# Patient Record
Sex: Male | Born: 1985 | Hispanic: No | Marital: Single | Smoking: Current some day smoker
Health system: Southern US, Community
[De-identification: ages and names within clinical notes are randomized; demographics above are authoritative.]

## PROBLEM LIST (undated history)

## (undated) DIAGNOSIS — G930 Cerebral cysts: Secondary | ICD-10-CM

## (undated) DIAGNOSIS — N200 Calculus of kidney: Secondary | ICD-10-CM

## (undated) DIAGNOSIS — K589 Irritable bowel syndrome without diarrhea: Secondary | ICD-10-CM

## (undated) DIAGNOSIS — F329 Major depressive disorder, single episode, unspecified: Secondary | ICD-10-CM

## (undated) DIAGNOSIS — F419 Anxiety disorder, unspecified: Secondary | ICD-10-CM

## (undated) DIAGNOSIS — F1110 Opioid abuse, uncomplicated: Secondary | ICD-10-CM

## (undated) DIAGNOSIS — D649 Anemia, unspecified: Secondary | ICD-10-CM

## (undated) DIAGNOSIS — G43909 Migraine, unspecified, not intractable, without status migrainosus: Secondary | ICD-10-CM

## (undated) DIAGNOSIS — F32A Depression, unspecified: Secondary | ICD-10-CM

## (undated) DIAGNOSIS — F1190 Opioid use, unspecified, uncomplicated: Secondary | ICD-10-CM

## (undated) HISTORY — DX: Depression, unspecified: F32.A

## (undated) HISTORY — DX: Migraine, unspecified, not intractable, without status migrainosus: G43.909

## (undated) HISTORY — DX: Anxiety disorder, unspecified: F41.9

## (undated) HISTORY — DX: Major depressive disorder, single episode, unspecified: F32.9

---

## 2016-01-20 ENCOUNTER — Emergency Department (HOSPITAL_BASED_OUTPATIENT_CLINIC_OR_DEPARTMENT_OTHER): Payer: Self-pay

## 2016-01-20 ENCOUNTER — Encounter (HOSPITAL_BASED_OUTPATIENT_CLINIC_OR_DEPARTMENT_OTHER): Payer: Self-pay

## 2016-01-20 ENCOUNTER — Emergency Department (HOSPITAL_BASED_OUTPATIENT_CLINIC_OR_DEPARTMENT_OTHER)
Admission: EM | Admit: 2016-01-20 | Discharge: 2016-01-20 | Disposition: A | Discharge status: Home / Self Care | Payer: Self-pay | Attending: Emergency Medicine | Admitting: Emergency Medicine

## 2016-01-20 DIAGNOSIS — R05 Cough: Secondary | ICD-10-CM | POA: Insufficient documentation

## 2016-01-20 DIAGNOSIS — M7989 Other specified soft tissue disorders: Secondary | ICD-10-CM | POA: Insufficient documentation

## 2016-01-20 DIAGNOSIS — H539 Unspecified visual disturbance: Secondary | ICD-10-CM | POA: Insufficient documentation

## 2016-01-20 DIAGNOSIS — R0789 Other chest pain: Secondary | ICD-10-CM | POA: Insufficient documentation

## 2016-01-20 DIAGNOSIS — R002 Palpitations: Secondary | ICD-10-CM | POA: Insufficient documentation

## 2016-01-20 DIAGNOSIS — F17210 Nicotine dependence, cigarettes, uncomplicated: Secondary | ICD-10-CM | POA: Insufficient documentation

## 2016-01-20 DIAGNOSIS — R109 Unspecified abdominal pain: Secondary | ICD-10-CM | POA: Insufficient documentation

## 2016-01-20 DIAGNOSIS — R509 Fever, unspecified: Secondary | ICD-10-CM | POA: Insufficient documentation

## 2016-01-20 HISTORY — DX: Opioid abuse, uncomplicated: F11.10

## 2016-01-20 HISTORY — DX: Irritable bowel syndrome without diarrhea: K58.9

## 2016-01-20 HISTORY — DX: Calculus of kidney: N20.0

## 2016-01-20 HISTORY — DX: Opioid use, unspecified, uncomplicated: F11.90

## 2016-01-20 HISTORY — DX: Anemia, unspecified: D64.9

## 2016-01-20 HISTORY — DX: Cerebral cysts: G93.0

## 2016-01-20 LAB — COMPREHENSIVE METABOLIC PANEL
ALT: 16 U/L — AB (ref 17–63)
AST: 20 U/L (ref 15–41)
Albumin: 3.9 g/dL (ref 3.5–5.0)
Alkaline Phosphatase: 78 U/L (ref 38–126)
Anion gap: 6 (ref 5–15)
BUN: 15 mg/dL (ref 6–20)
CHLORIDE: 107 mmol/L (ref 101–111)
CO2: 27 mmol/L (ref 22–32)
CREATININE: 0.55 mg/dL — AB (ref 0.61–1.24)
Calcium: 9.4 mg/dL (ref 8.9–10.3)
GFR calc Af Amer: >60 mL/min (ref >60)
GFR calc non Af Amer: >60 mL/min (ref >60)
Glucose, Bld: 81 mg/dL (ref 65–99)
Potassium: 3.7 mmol/L (ref 3.5–5.1)
SODIUM: 140 mmol/L (ref 135–145)
Total Bilirubin: 0.4 mg/dL (ref 0.3–1.2)
Total Protein: 7.6 g/dL (ref 6.5–8.1)

## 2016-01-20 LAB — CBC WITH DIFFERENTIAL/PLATELET
BASOS ABS: 0 10*3/uL (ref 0.0–0.1)
Basophils Relative: 0 %
Eosinophils Absolute: 0.2 10*3/uL (ref 0.0–0.7)
Eosinophils Relative: 3 %
HEMATOCRIT: 40.1 % (ref 39.0–52.0)
HEMOGLOBIN: 13.6 g/dL (ref 13.0–17.0)
LYMPHS PCT: 30 %
Lymphs Abs: 2.8 10*3/uL (ref 0.7–4.0)
MCH: 26.4 pg (ref 26.0–34.0)
MCHC: 33.9 g/dL (ref 30.0–36.0)
MCV: 77.7 fL — AB (ref 78.0–100.0)
MONO ABS: 0.8 10*3/uL (ref 0.1–1.0)
Monocytes Relative: 8 %
NEUTROS ABS: 5.5 10*3/uL (ref 1.7–7.7)
NEUTROS PCT: 59 %
Platelets: 303 10*3/uL (ref 150–400)
RBC: 5.16 MIL/uL (ref 4.22–5.81)
RDW: 15.4 % (ref 11.5–15.5)
WBC: 9.4 10*3/uL (ref 4.0–10.5)

## 2016-01-20 LAB — LIPASE, BLOOD: Lipase: 66 U/L — ABNORMAL HIGH (ref 11–51)

## 2016-01-20 LAB — TROPONIN I: Troponin I: <0.03 ng/mL (ref <0.031)

## 2016-01-20 LAB — D-DIMER, QUANTITATIVE: D-Dimer, Quant: 1.28 ug/mL-FEU — ABNORMAL HIGH (ref 0.00–0.50)

## 2016-01-20 MED ORDER — IOPAMIDOL (ISOVUE-370) INJECTION 76%
100.0000 mL | Freq: Once | INTRAVENOUS | Status: AC | PRN
  Administered 2016-01-20: 100 mL via INTRAVENOUS

## 2016-01-20 MED ORDER — SODIUM CHLORIDE 0.9 % IV BOLUS (SEPSIS)
2000.0000 mL | Freq: Once | INTRAVENOUS | Status: AC
  Administered 2016-01-20: 2000 mL via INTRAVENOUS

## 2016-01-20 MED ORDER — IOPAMIDOL (ISOVUE-300) INJECTION 61%: 100.0000 mL | Freq: Once | INTRAVENOUS | Status: DC | PRN

## 2016-01-20 MED ORDER — PROMETHAZINE HCL 25 MG PO TABS: 25.0000 mg | ORAL_TABLET | Freq: Four times a day (QID) | ORAL | Status: DC | PRN

## 2016-01-20 MED ORDER — ZOLPIDEM TARTRATE 5 MG PO TABS: 5.0000 mg | ORAL_TABLET | Freq: Every evening | ORAL | Status: AC | PRN

## 2016-01-20 NOTE — ED Notes (Signed)
Pt c/o CP since 1pm-at [facility] for heroin abuse since last thursday

## 2016-01-20 NOTE — ED Notes (Signed)
MD at bedside. 

## 2016-01-20 NOTE — ED Provider Notes (Signed)
CSN: [id number]     Arrival date & time 01/20/16  1736 History  By signing my name below, I, [facility], attest that this documentation has been prepared under the direction and in the presence of Pricilla Loveless, MD. Electronically Signed: Randell Patient, ED Scribe. 01/20/2016. 10:47 PM.   Chief Complaint  Patient presents with  . Chest Pain   The history is provided by the patient. No language interpreter was used.   HPI Comments: Sean Rios is a 30 y.o. male with an hx of heroin abuse, kidney stones, IBS, anemia, and opiate misuse who presents to the Emergency Department complaining of constant, waxing and waning, moderate CP that he describes as tightness onset 6 hours ago. Pt states that he is detoxing from heroin at [facility] for the past 5 days and has been having withdrawal symptoms including diaphoresis. He reports that today he noticed chest tightness with heart palpations, spots in his eyes, tremors, SOB while walking in the facility. He reports cough, fever TMAX of 99, bilateral ankle swelling, and mild abdominal pain. Chest tightness, visual disturbance, heart palpitations, tremors, and SOB improved by lying down. Per pt, he last used heroin 5 days ago. He is a current cigarette smoker. He is allergic to aspirin, ibuprofen, naproxen, and Tylenol which cause him hematuria. Denies taking any other illicit drugs. Denies hx of HTN. Denies hemoptysis, diarrhea, or any other symptoms currently.  Past Medical History  Diagnosis Date  . Heroin abuse   . Arachnoid cyst   . Kidney stone   . IBS (irritable bowel syndrome)   . Anemia   . Opiate misuse    History reviewed. No pertinent past surgical history. No family history on file. Social History  Substance Use Topics  . Smoking status: Current Some Day Smoker  . Smokeless tobacco: None  . Alcohol Use: No    Review of Systems  Constitutional: Positive for fever.  Eyes: Positive for visual disturbance.  Respiratory:  Positive for cough, chest tightness and shortness of breath.   Cardiovascular: Positive for palpitations and leg swelling.  Gastrointestinal: Positive for abdominal pain. Negative for diarrhea.  Neurological: Positive for tremors.  All other systems reviewed and are negative.  Allergies  Aspirin; Ibuprofen; Naproxen; and Tylenol  Home Medications   Prior to Admission medications   Not on File   BP 145/92 mmHg  Pulse 95  Temp(Src) 97.8 F (36.6 C) (Oral)  Resp 31  Ht 6\' 2"  (1.88 m)  Wt 130 lb (58.968 kg)  BMI 16.68 kg/m2  SpO2 100% Physical Exam  Constitutional: He is oriented to person, place, and time. He appears well-developed and well-nourished.  HENT:  Head: Normocephalic and atraumatic.  Right Ear: External ear normal.  Left Ear: External ear normal.  Nose: Nose normal.  Eyes: Right eye exhibits no discharge. Left eye exhibits no discharge.  Neck: Neck supple.  Cardiovascular: Normal rate, regular rhythm, normal heart sounds and intact distal pulses.   HR ~100. 2+ radial pulses bilaterally.  Pulmonary/Chest: Effort normal and breath sounds normal. He exhibits tenderness.  Diffuse mild chest tenderness.  Abdominal: Soft. There is no tenderness.  Musculoskeletal: He exhibits no edema.  Neurological: He is alert and oriented to person, place, and time.  Skin: Skin is warm and dry.  Nursing note and vitals reviewed.   ED Course  Procedures   DIAGNOSTIC STUDIES: Oxygen Saturation is 100% on RA, normal by my interpretation.    COORDINATION OF CARE: 7:07 PM Will order IV fluids, chest  x-ray, and labs. Discussed treatment plan with pt at bedside and pt agreed to plan.  10:46 PM Reviewed labs and chest imaging results. Will discharge pt.  Labs Review Labs Reviewed  COMPREHENSIVE METABOLIC PANEL - Abnormal; Notable for the following:    Creatinine, Ser 0.55 (*)    ALT 16 (*)    All other components within normal limits  LIPASE, BLOOD - Abnormal; Notable for the  following:    Lipase 66 (*)    All other components within normal limits  CBC WITH DIFFERENTIAL/PLATELET - Abnormal; Notable for the following:    MCV 77.7 (*)    All other components within normal limits  D-DIMER, QUANTITATIVE (NOT AT [facility]) - Abnormal; Notable for the following:    D-Dimer, Quant 1.28 (*)    All other components within normal limits  TROPONIN I  TROPONIN I    Imaging Review Dg Chest 2 View  01/20/2016  CLINICAL DATA:  Chest pain. EXAM: CHEST  2 VIEW COMPARISON:  None. FINDINGS: The heart size and mediastinal contours are within normal limits. Both lungs are clear. No pneumothorax or pleural effusion is noted. The visualized skeletal structures are unremarkable. IMPRESSION: No active cardiopulmonary disease. Electronically Signed   By: Lupita Raider, M.D.   On: 01/20/2016 20:42   Ct Angio Chest Pe W/cm &/or Wo Cm  01/20/2016  CLINICAL DATA:  30 year old male with shortness of breath and elevated D-dimer. EXAM: CT ANGIOGRAPHY CHEST WITH CONTRAST TECHNIQUE: Multidetector CT imaging of the chest was performed using the standard protocol during bolus administration of intravenous contrast. Multiplanar CT image reconstructions and MIPs were obtained to evaluate the vascular anatomy. CONTRAST:  75 cc Isovue 370 COMPARISON:  Chest radiograph dated 01/20/2016 FINDINGS: The lungs are clear. There is no pleural effusion or pneumothorax. The central airways are patent. The thoracic aorta and the visualized the origins of the great vessels of the aortic arch appear patent. There is no CT evidence of pulmonary embolism. There is no cardiomegaly or pericardial effusion. No hilar or mediastinal adenopathy. The esophagus and the thyroid gland are grossly unremarkable. There is no axillary adenopathy. The chest wall soft tissues appear unremarkable. The osseous structures are intact. The visualized upper abdomen appears unremarkable. Review of the MIP images confirms the above findings.  IMPRESSION: No acute intrathoracic pathology. No CT evidence of pulmonary embolism. Electronically Signed   By: Elgie Collard M.D.   On: 01/20/2016 21:56   I have personally reviewed and evaluated these images and lab results as part of my medical decision-making.   EKG Interpretation   Date/Time:  Monday Jan 20 2016 17:48:53 EDT Ventricular Rate:  121 PR Interval:  158 QRS Duration: 88 QT Interval:  310 QTC Calculation: 440 R Axis:   98 Text Interpretation:  Sinus tachycardia Rightward axis Borderline ECG No  old tracing to compare Confirmed by Shalunda Lindh MD, Roselin Wiemann [phone]) on  01/20/2016 6:51:19 PM      MDM   Final diagnoses:  Chest tightness    No obvious cause for the patient's chest tightness or shortness of breath. He was initially tachycardic which appears sinus. This likely accounts for the palpitations feeling. However he does not appear to have increased work of breathing currently and is resting comfortably. CT scan shows no PE. Possibly this is related to some of his withdrawal given relatively recent hair when recurrence. However he currently appears stable besides mild hypertension. Given he has never had this before I do not think he needs to  be emergently started on blood pressure medicine. Recommend he check his blood pressure at home and follow-up with her PCP if this continues. This is highly unlikely to be ACS based on presentation. Discharged with return precautions.  I personally performed the services described in this documentation, which was scribed in my presence. The recorded information has been reviewed and is accurate.    Pricilla LovelessScott Jack Mineau, MD 01/21/16 [phone]

## 2016-01-20 NOTE — ED Notes (Signed)
Patient transported to CT 

## 2016-01-22 ENCOUNTER — Encounter (HOSPITAL_BASED_OUTPATIENT_CLINIC_OR_DEPARTMENT_OTHER): Payer: Self-pay | Admitting: Emergency Medicine

## 2016-01-22 ENCOUNTER — Emergency Department (HOSPITAL_BASED_OUTPATIENT_CLINIC_OR_DEPARTMENT_OTHER)
Admission: EM | Admit: 2016-01-22 | Discharge: 2016-01-22 | Disposition: A | Discharge status: Home / Self Care | Payer: Self-pay | Attending: Emergency Medicine | Admitting: Emergency Medicine

## 2016-01-22 DIAGNOSIS — F17200 Nicotine dependence, unspecified, uncomplicated: Secondary | ICD-10-CM | POA: Insufficient documentation

## 2016-01-22 DIAGNOSIS — R000 Tachycardia, unspecified: Secondary | ICD-10-CM | POA: Insufficient documentation

## 2016-01-22 DIAGNOSIS — I10 Essential (primary) hypertension: Secondary | ICD-10-CM | POA: Insufficient documentation

## 2016-01-22 LAB — TROPONIN I

## 2016-01-22 MED ORDER — METOPROLOL TARTRATE 50 MG PO TABS
50.0000 mg | ORAL_TABLET | Freq: Once | ORAL | Status: AC
  Administered 2016-01-22: 50 mg via ORAL
  Filled 2016-01-22: qty 1

## 2016-01-22 MED ORDER — METOPROLOL SUCCINATE ER 50 MG PO TB24: 50.0000 mg | ORAL_TABLET | Freq: Two times a day (BID) | ORAL | Status: DC

## 2016-01-22 NOTE — ED Notes (Signed)
Pt in c/o chest pain which he was seen here previously for this week with negative workup. D-dimer was elevated and pt was tachycardic at that time. Continues to be tachycardic and that's what he was sent here today. [facility].

## 2016-01-22 NOTE — ED Notes (Signed)
C/o chest pain increased w standing, sitting or movement,  States it is less when lying,  Was seen here Monday for same

## 2016-01-22 NOTE — Discharge Instructions (Signed)
Metoprolol as prescribed.  Follow-up with primary doctor in the next week for a recheck.   Hypertension Hypertension is another name for high blood pressure. High blood pressure forces your heart to work harder to pump blood. A blood pressure reading has two numbers, which includes a higher number over a lower number (example: 110/72). HOME CARE   Have your blood pressure rechecked by your doctor.  Only take medicine as told by your doctor. Follow the directions carefully. The medicine does not work as well if you skip doses. Skipping doses also puts you at risk for problems.  Do not smoke.  Monitor your blood pressure at home as told by your doctor. GET HELP IF:  You think you are having a reaction to the medicine you are taking.  You have repeat headaches or feel dizzy.  You have puffiness (swelling) in your ankles.  You have trouble with your vision. GET HELP RIGHT AWAY IF:   You get a very bad headache and are confused.  You feel weak, numb, or faint.  You get chest or belly (abdominal) pain.  You throw up (vomit).  You cannot breathe very well. MAKE SURE YOU:   Understand these instructions.  Will watch your condition.  Will get help right away if you are not doing well or get worse.   This information is not intended to replace advice given to you by your health care provider. Make sure you discuss any questions you have with your health care provider.   Document Released: 02/10/2008 Document Revised: 08/29/2013 Document Reviewed: 06/16/2013 Elsevier Interactive Patient Education 2016 [employer] Inc[employer]  Nonspecific Tachycardia Tachycardia is a faster than normal heartbeat (more than 100 beats per minute). In adults, the heart normally beats between 60 and 100 times a minute. A fast heartbeat may be a normal response to exercise or stress. It does not necessarily mean that something is wrong. However, sometimes when your heart beats too fast it may not be able to  pump enough blood to the rest of your body. This can result in chest pain, shortness of breath, dizziness, and even fainting. Nonspecific tachycardia means that the specific cause or pattern of your tachycardia is unknown. CAUSES  Tachycardia may be harmless or it may be due to a more serious underlying cause. Possible causes of tachycardia include:  Exercise or exertion.  Fever.  Pain or injury.  Infection.  Loss of body fluids (dehydration).  Overactive thyroid.  Lack of red blood cells (anemia).  Anxiety and stress.  Alcohol.  Caffeine.  Tobacco products.  Diet pills.  Illegal drugs.  Heart disease. SYMPTOMS  Rapid or irregular heartbeat (palpitations).  Suddenly feeling your heart beating (cardiac awareness).  Dizziness.  Tiredness (fatigue).  Shortness of breath.  Chest pain.  Nausea.  Fainting. DIAGNOSIS  Your caregiver will perform a physical exam and take your medical history. In some cases, a heart specialist (cardiologist) may be consulted. Your caregiver may also order:  Blood tests.  Electrocardiography. This test records the electrical activity of your heart.  A heart monitoring test. TREATMENT  Treatment will depend on the likely cause of your tachycardia. The goal is to treat the underlying cause of your tachycardia. Treatment methods may include:  Replacement of fluids or blood through an intravenous (IV) tube for moderate to severe dehydration or anemia.  New medicines or changes in your current medicines.  Diet and lifestyle changes.  Treatment for certain infections.  Stress relief or relaxation methods. HOME CARE INSTRUCTIONS  Rest.  Drink enough fluids to keep your urine clear or pale yellow.  Do not smoke.  Avoid:  Caffeine.  Tobacco.  Alcohol.  Chocolate.  Stimulants such as over-the-counter diet pills or pills that help you stay awake.  Situations that cause anxiety or stress.  Illegal drugs such as  marijuana, phencyclidine (PCP), and cocaine.  Only take medicine as directed by your caregiver.  Keep all follow-up appointments as directed by your caregiver. SEEK IMMEDIATE MEDICAL CARE IF:   You have pain in your chest, upper arms, jaw, or neck.  You become weak, dizzy, or feel faint.  You have palpitations that will not go away.  You vomit, have diarrhea, or pass blood in your stool.  Your skin is cool, pale, and wet.  You have a fever that will not go away with rest, fluids, and medicine. MAKE SURE YOU:   Understand these instructions.  Will watch your condition.  Will get help right away if you are not doing well or get worse.   This information is not intended to replace advice given to you by your health care provider. Make sure you discuss any questions you have with your health care provider.   Document Released: 10/01/2004 Document Revised: 11/16/2011 Document Reviewed: 03/08/2015 Elsevier Interactive Patient Education [employer]

## 2016-01-22 NOTE — ED Provider Notes (Signed)
CSN: [id number]     Arrival date & time 01/22/16  1920 History  By signing my name below, I, [facility], attest that this documentation has been prepared under the direction and in the presence of Geoffery Lyons, MD. Electronically Signed: Randell Patient, ED Scribe. 01/22/2016. 9:12 PM.   Chief Complaint  Patient presents with  . Chest Pain   HPI Comments: Sean Rios is a 30 y.o. male brought in from [facility] who presents to the Emergency Department complaining of constant, unchanging CP that he describes as tightness that radiates to his back onset 2 days ago. Pt has been at [facility] for the past 6 days detoxing from heroin and was seen for same complaint 2 days ago where he received a negative chest imaging and was discharged. He notes that he was told by the [facility] staff that he is still tachycardic with an elevated BP and was advised to present to the ED today. He reports associated heart palpitations, SOB, visual disturbance that he describes as spots in his eyes for the same time period. He reports that he is detoxing for heroin and last used 1 week ago. Denies similar symptoms in the past. Denies any other symptoms currently.   Patient is a 30 y.o. male presenting with chest pain. The history is provided by the patient. No language interpreter was used.  Chest Pain Pain location:  Unable to specify Pain quality: tightness   Pain radiates to:  Upper back Pain radiates to the back: yes   Pain severity:  Moderate Onset quality:  Gradual Duration:  2 days Timing:  Constant Progression:  Unchanged Chronicity:  New Relieved by:  None tried Worsened by:  Nothing tried Ineffective treatments:  None tried Associated symptoms: palpitations and shortness of breath     Past Medical History  Diagnosis Date  . Heroin abuse   . Arachnoid cyst   . Kidney stone   . IBS (irritable bowel syndrome)   . Anemia   . Opiate misuse    History reviewed. No pertinent past surgical  history. History reviewed. No pertinent family history. Social History  Substance Use Topics  . Smoking status: Current Some Day Smoker  . Smokeless tobacco: None  . Alcohol Use: No    Review of Systems  Eyes: Positive for visual disturbance.  Respiratory: Positive for chest tightness and shortness of breath.   Cardiovascular: Positive for chest pain and palpitations.  All other systems reviewed and are negative.  Allergies  Aspirin; Ibuprofen; Naproxen; and Tylenol  Home Medications   Prior to Admission medications   Medication Sig Start Date End Date Taking? Authorizing Provider  promethazine (PHENERGAN) 25 MG tablet Take 1 tablet (25 mg total) by mouth every 6 (six) hours as needed for nausea or vomiting. 01/20/16   Pricilla Loveless, MD  zolpidem (AMBIEN) 5 MG tablet Take 1 tablet (5 mg total) by mouth at bedtime as needed for sleep. 01/20/16   Pricilla Loveless, MD   BP 129/89 mmHg  Pulse 96  Temp(Src) 98.2 F (36.8 C) (Oral)  Resp 18  Ht  (1.88 m)  Wt 130 lb (58.968 kg)  BMI 16.68 kg/m2  SpO2 99% Physical Exam  Constitutional: He is oriented to person, place, and time. He appears well-developed and well-nourished.  HENT:  Head: Normocephalic and atraumatic.  Eyes: EOM are normal.  Neck: Normal range of motion.  Cardiovascular: Normal rate, regular rhythm, normal heart sounds and intact distal pulses.   Pulmonary/Chest: Effort normal and breath sounds  normal. No respiratory distress.  Abdominal: Soft. He exhibits no distension. There is no tenderness.  Musculoskeletal: Normal range of motion.  Neurological: He is alert and oriented to person, place, and time.  Skin: Skin is warm and dry.  Psychiatric: He has a normal mood and affect. Judgment normal.  Nursing note and vitals reviewed.   ED Course  Procedures   DIAGNOSTIC STUDIES: Oxygen Saturation is 100% on RA, normal by my interpretation.    COORDINATION OF CARE: 7:54 PM Will order labs and metoprolol.  Discussed treatment plan with pt at bedside and pt agreed to plan.  9:12 PM Returned to discuss results of labs and re-check pt.  Labs Review Labs Reviewed  TROPONIN I    Imaging Review Ct Angio Chest Pe W/cm &/or Wo Cm  01/20/2016  CLINICAL DATA:  30 year old male with shortness of breath and elevated D-dimer. EXAM: CT ANGIOGRAPHY CHEST WITH CONTRAST TECHNIQUE: Multidetector CT imaging of the chest was performed using the standard protocol during bolus administration of intravenous contrast. Multiplanar CT image reconstructions and MIPs were obtained to evaluate the vascular anatomy. CONTRAST:  75 cc Isovue 370 COMPARISON:  Chest radiograph dated 01/20/2016 FINDINGS: The lungs are clear. There is no pleural effusion or pneumothorax. The central airways are patent. The thoracic aorta and the visualized the origins of the great vessels of the aortic arch appear patent. There is no CT evidence of pulmonary embolism. There is no cardiomegaly or pericardial effusion. No hilar or mediastinal adenopathy. The esophagus and the thyroid gland are grossly unremarkable. There is no axillary adenopathy. The chest wall soft tissues appear unremarkable. The osseous structures are intact. The visualized upper abdomen appears unremarkable. Review of the MIP images confirms the above findings. IMPRESSION: No acute intrathoracic pathology. No CT evidence of pulmonary embolism. Electronically Signed   By: Elgie CollardArash  Radparvar M.D.   On: 01/20/2016 21:56   I have personally reviewed and evaluated these images and lab results as part of my medical decision-making.   EKG Interpretation   Date/Time:  Wednesday Jan 22 2016 19:26:02 EDT Ventricular Rate:  113 PR Interval:  120 QRS Duration: 90 QT Interval:  320 QTC Calculation: 438 R Axis:   95 Text Interpretation:  Sinus tachycardia Rightward axis Abnormal ECG  Confirmed by Miyo Aina  MD, Azrael Maddix ([id number]) on 01/22/2016 7:44:19 PM      MDM   Final diagnoses:  None     [facility] of elevated blood pressure and heart rate. The patient is there rehabbing from heroin addiction. He was seen here 2 days ago and had an extensive workup which revealed no acute abnormality. His troponin was repeated today and was negative. His EKG is unchanged. He was given 50 mg of Lopressor and his heart rate is now in the 90s and blood pressure is much improved. He will be discharged with a prescription for Lopressor and advised to follow-up with his primary Dr. in the near future.  I personally performed the services described in this documentation, which was scribed in my presence. The recorded information has been reviewed and is accurate.       Geoffery Lyonsouglas Kerie Badger, MD 01/22/16 2115

## 2016-04-24 ENCOUNTER — Ambulatory Visit (HOSPITAL_COMMUNITY)
Admission: EM | Admit: 2016-04-24 | Discharge: 2016-04-24 | Disposition: A | Discharge status: Home / Self Care | Payer: Self-pay | Attending: Physician Assistant | Admitting: Physician Assistant

## 2016-04-24 ENCOUNTER — Encounter (HOSPITAL_COMMUNITY): Payer: Self-pay | Admitting: Emergency Medicine

## 2016-04-24 DIAGNOSIS — R519 Headache, unspecified: Secondary | ICD-10-CM

## 2016-04-24 DIAGNOSIS — G930 Cerebral cysts: Secondary | ICD-10-CM | POA: Insufficient documentation

## 2016-04-24 DIAGNOSIS — F329 Major depressive disorder, single episode, unspecified: Secondary | ICD-10-CM | POA: Insufficient documentation

## 2016-04-24 DIAGNOSIS — F17210 Nicotine dependence, cigarettes, uncomplicated: Secondary | ICD-10-CM | POA: Insufficient documentation

## 2016-04-24 DIAGNOSIS — R51 Headache: Secondary | ICD-10-CM | POA: Insufficient documentation

## 2016-04-24 DIAGNOSIS — K0889 Other specified disorders of teeth and supporting structures: Secondary | ICD-10-CM

## 2016-04-24 DIAGNOSIS — Z79899 Other long term (current) drug therapy: Secondary | ICD-10-CM | POA: Insufficient documentation

## 2016-04-24 DIAGNOSIS — K029 Dental caries, unspecified: Secondary | ICD-10-CM | POA: Insufficient documentation

## 2016-04-24 DIAGNOSIS — F32A Depression, unspecified: Secondary | ICD-10-CM

## 2016-04-24 LAB — CBC WITH DIFFERENTIAL/PLATELET
Basophils Absolute: 0 10*3/uL (ref 0.0–0.1)
Basophils Relative: 0 %
EOS ABS: 0.2 10*3/uL (ref 0.0–0.7)
EOS PCT: 6 %
HCT: 39.1 % (ref 39.0–52.0)
HEMOGLOBIN: 12.6 g/dL — AB (ref 13.0–17.0)
LYMPHS ABS: 1.7 10*3/uL (ref 0.7–4.0)
Lymphocytes Relative: 39 %
MCH: 26.4 pg (ref 26.0–34.0)
MCHC: 32.2 g/dL (ref 30.0–36.0)
MCV: 81.8 fL (ref 78.0–100.0)
MONOS PCT: 9 %
Monocytes Absolute: 0.4 10*3/uL (ref 0.1–1.0)
NEUTROS ABS: 2 10*3/uL (ref 1.7–7.7)
NEUTROS PCT: 46 %
Platelets: 190 10*3/uL (ref 150–400)
RBC: 4.78 MIL/uL (ref 4.22–5.81)
RDW: 14.4 % (ref 11.5–15.5)
WBC: 4.3 10*3/uL (ref 4.0–10.5)

## 2016-04-24 LAB — COMPREHENSIVE METABOLIC PANEL
ALBUMIN: 4.1 g/dL (ref 3.5–5.0)
ALK PHOS: 86 U/L (ref 38–126)
ALT: 21 U/L (ref 17–63)
AST: 26 U/L (ref 15–41)
Anion gap: 4 — ABNORMAL LOW (ref 5–15)
BILIRUBIN TOTAL: 0.6 mg/dL (ref 0.3–1.2)
BUN: 11 mg/dL (ref 6–20)
CHLORIDE: 109 mmol/L (ref 101–111)
CO2: 26 mmol/L (ref 22–32)
Calcium: 9.3 mg/dL (ref 8.9–10.3)
Creatinine, Ser: 0.73 mg/dL (ref 0.61–1.24)
GFR calc Af Amer: >60 mL/min (ref >60)
GFR calc non Af Amer: >60 mL/min (ref >60)
Glucose, Bld: 100 mg/dL — ABNORMAL HIGH (ref 65–99)
Potassium: 3.2 mmol/L — ABNORMAL LOW (ref 3.5–5.1)
Sodium: 139 mmol/L (ref 135–145)
Total Protein: 6.8 g/dL (ref 6.5–8.1)

## 2016-04-24 MED ORDER — AMOXICILLIN 500 MG PO CAPS: 500.0000 mg | ORAL_CAPSULE | Freq: Three times a day (TID) | ORAL | 0 refills | Status: DC

## 2016-04-24 MED ORDER — CITALOPRAM HYDROBROMIDE 20 MG PO TABS: 20.0000 mg | ORAL_TABLET | Freq: Every day | ORAL | 0 refills | Status: DC

## 2016-04-24 NOTE — ED Provider Notes (Signed)
CSN: [id number]     Arrival date & time 04/24/16  1823 History   First MD Initiated Contact with Patient 04/24/16 1856     Chief Complaint  Patient presents with  . Dental Pain  . Headache  . Abdominal Pain  . Depression   (Consider location/radiation/quality/duration/timing/severity/associated sxs/prior Treatment) HPI MULTIPLE ISSUES: HAS ARACHNOID CYST WHICH PATIENT STATES IS CAUSING HIM TO HAVE CHRONIC HEADACHE, SOME GAIT ISSUES. RECENT ABDOMINAL PAIN RUQ, WITH HX OF ELEVATED LFT'S IN THE PAST. DENIES ANY NEW USE OF DRUGS OR ALCOHOL. C/O OF PAIN IN MOUTH FROM CHRONIC DENTAL INFECTION. DENIES ABSCESS, FEVER, ALSO FEELS DEPRESSED. WAS IN REHAB, AND DID NOT GET HIS CITALAPRAM RX ON DISCHARGE. ALSO LOOKING FOR PCP.  Past Medical History:  Diagnosis Date  . Anemia   . Arachnoid cyst   . Heroin abuse   . IBS (irritable bowel syndrome)   . Kidney stone   . Opiate misuse    History reviewed. No pertinent surgical history. History reviewed. No pertinent family history. Social History  Substance Use Topics  . Smoking status: Current Some Day Smoker    Packs/day: 0.25    Types: Cigarettes  . Smokeless tobacco: Never Used  . Alcohol use No    Review of Systems  Denies: HEADACHE, NAUSEA, ABDOMINAL PAIN, CHEST PAIN, CONGESTION, DYSURIA, SHORTNESS OF BREATH  Allergies  Aspirin; Ibuprofen; Naproxen; and Tylenol [acetaminophen]  Home Medications   Prior to Admission medications   Medication Sig Start Date End Date Taking? Authorizing Provider  amoxicillin (AMOXIL) 500 MG capsule Take 1 capsule (500 mg total) by mouth 3 (three) times daily. 04/24/16   Tharon AquasFrank C Patrick, PA  citalopram (CELEXA) 20 MG tablet Take 1 tablet (20 mg total) by mouth daily. 04/24/16   Tharon AquasFrank C Patrick, PA  metoprolol succinate (TOPROL XL) 50 MG 24 hr tablet Take 1 tablet (50 mg total) by mouth 2 (two) times daily. Take with or immediately following a meal. 01/22/16   Geoffery Lyonsouglas Delo, MD  promethazine (PHENERGAN) 25 MG  tablet Take 1 tablet (25 mg total) by mouth every 6 (six) hours as needed for nausea or vomiting. 01/20/16   Pricilla LovelessScott Goldston, MD  zolpidem (AMBIEN) 5 MG tablet Take 1 tablet (5 mg total) by mouth at bedtime as needed for sleep. 01/20/16   Pricilla LovelessScott Goldston, MD   Meds Ordered and Administered this Visit  Medications - No data to display  BP 118/71 (BP Location: Right Arm)   Pulse 86   Temp 98.1 F (36.7 C) (Oral)   Resp 16   SpO2 96%  No data found.   Physical Exam NURSES NOTES AND VITAL SIGNS REVIEWED. CONSTITUTIONAL: Well developed, well nourished, no acute distress HEENT: normocephalic, atraumatic TEETH ARE DECAYED AND NOT IN GOOD REPAIR, MULTIPLE DENTAL EROSIONS.  EYES: Conjunctiva normal NECK:normal ROM, supple, no adenopathy PULMONARY:No respiratory distress, normal effort ABDOMINAL: Soft, ND, NT BS+, No CVAT MUSCULOSKELETAL: Normal ROM of all extremities,  SKIN: warm and dry without rash PSYCHIATRIC: Mood and affect, behavior are normal, NO SUICIDAL IDEATION OR THOUGHTS SADCAGES, SOME INSOMNIA, SOME DEPRESSIVE THOUGHTS, ABLE TO WORK, FUNCTIONS AS CLOSE TO NORMAL AS HE CONSIDERS NORMAL.   Urgent Care Course   Clinical Course    Procedures (including critical care time)  Labs Review Labs Reviewed  CBC WITH DIFFERENTIAL/PLATELET - Abnormal; Notable for the following:       Result Value   Hemoglobin 12.6 (*)    All other components within normal limits  HEPATITIS PANEL, ACUTE  COMPREHENSIVE  METABOLIC PANEL    Imaging Review No results found.   Visual Acuity Review  Right Eye Distance:   Left Eye Distance:   Bilateral Distance:    Right Eye Near:   Left Eye Near:    Bilateral Near:        AMOXIL CITALAPRAM REFER TO [facility]. MDM   1. Pain, dental   2. Nonintractable headache, unspecified chronicity pattern, unspecified headache type   3. Pain due to dental caries   4. Depression     Patient is reassured  that there are no issues that require transfer to higher level of care at this time or additional tests. Patient is advised to continue home symptomatic treatment. Patient is advised that if there are new or worsening symptoms to attend the emergency department, contact primary care provider, or return to UC. Instructions of care provided discharged home in stable condition.    THIS NOTE WAS GENERATED USING A VOICE RECOGNITION SOFTWARE PROGRAM. ALL REASONABLE EFFORTS  WERE MADE TO PROOFREAD THIS DOCUMENT FOR ACCURACY.  I have verbally reviewed the discharge instructions with the patient. A printed AVS was given to the patient.  All questions were answered prior to discharge.      Tharon AquasFrank C Patrick, [place] 04/24/16 2035

## 2016-04-24 NOTE — ED Triage Notes (Signed)
Pt here with several complaints today.  He does not have a PCP.  Pt states he has a cyst on his brain that was diagnosed two years ago.  He has been having increasing headaches and some memory loss.  He would like to have a CT done.  Pt reports liver function issues and is having some "pain in my liver".  He would like to have his enzymes checked.  Pt suffers from depression and would like a referral to a behavioral health clinic to get some medication.  He denies any suicidal ideation at this time, but has had some in the past.  Pt has two broken teeth in his upper jaw, bilaterally.  He has some mild pain, but only when he chews.  He is wanting an antibiotic to treat infection.  He does not want any narcotics as he states he is 100 days clean from opiates.

## 2016-04-24 NOTE — Discharge Instructions (Signed)
If the cyst is causing symptoms or is located in a part of the brain where continued growth would cause a problem, your doctor may suggest surgery to remove the cyst. The usual procedure is to drain and attempt to remove the entire cyst, including its outermost lining. Sometimes, when this is not feasible, the surgeon will open the cyst wall to drain the contents into the normal cerebrospinal fluid pathways. If the cyst is blocking the flow of cerebrospinal fluid through the brain, a shunt may be used to help divert the fluid and restore its unencumbered flow. If the fluid in the cyst is aspirated through a needle, without the cyst wall being addressed, the fluid generally re-accumulates rapidly.

## 2016-04-26 LAB — HEPATITIS PANEL, ACUTE
Hep A IgM: NEGATIVE
Hep B C IgM: NEGATIVE
Hepatitis B Surface Ag: NEGATIVE

## 2016-05-04 ENCOUNTER — Telehealth (HOSPITAL_COMMUNITY): Payer: Self-pay | Admitting: Emergency Medicine

## 2016-05-04 NOTE — Telephone Encounter (Signed)
-----   Message from Eustace MooreLaura W Murray, MD sent at 04/29/2016  8:37 AM EDT ----- Please let patient know that test for hepatitis C was positive.   Needs to make appointment with [facility] care provider [facility] to discuss  further evaluation, management. LM

## 2016-05-04 NOTE — Telephone Encounter (Signed)
Called pt and notified of recent lab results from visit 8/18 Pt ID'd properly... Reports feeling better  Adv pt to f/u w/PCP asap so he can be treated Education on safe sex given Also adv pt to notify partner(s) [facility] verb understanding.

## 2016-07-03 ENCOUNTER — Emergency Department (HOSPITAL_COMMUNITY): Payer: Self-pay

## 2016-07-03 ENCOUNTER — Encounter (HOSPITAL_COMMUNITY): Payer: Self-pay | Admitting: Emergency Medicine

## 2016-07-03 ENCOUNTER — Emergency Department (HOSPITAL_COMMUNITY)
Admission: EM | Admit: 2016-07-03 | Discharge: 2016-07-03 | Disposition: A | Discharge status: Home / Self Care | Payer: Self-pay | Attending: Emergency Medicine | Admitting: Emergency Medicine

## 2016-07-03 DIAGNOSIS — Z79899 Other long term (current) drug therapy: Secondary | ICD-10-CM | POA: Insufficient documentation

## 2016-07-03 DIAGNOSIS — R42 Dizziness and giddiness: Secondary | ICD-10-CM | POA: Insufficient documentation

## 2016-07-03 DIAGNOSIS — F17210 Nicotine dependence, cigarettes, uncomplicated: Secondary | ICD-10-CM | POA: Insufficient documentation

## 2016-07-03 LAB — URINE MICROSCOPIC-ADD ON

## 2016-07-03 LAB — URINALYSIS, ROUTINE W REFLEX MICROSCOPIC
Bilirubin Urine: NEGATIVE
GLUCOSE, UA: NEGATIVE mg/dL
KETONES UR: NEGATIVE mg/dL
LEUKOCYTES UA: NEGATIVE
Nitrite: NEGATIVE
PROTEIN: NEGATIVE mg/dL
Specific Gravity, Urine: 1.02 (ref 1.005–1.030)
pH: 8.5 — ABNORMAL HIGH (ref 5.0–8.0)

## 2016-07-03 LAB — CBC
HCT: 39.3 % (ref 39.0–52.0)
Hemoglobin: 13.3 g/dL (ref 13.0–17.0)
MCH: 27.9 pg (ref 26.0–34.0)
MCHC: 33.8 g/dL (ref 30.0–36.0)
MCV: 82.4 fL (ref 78.0–100.0)
PLATELETS: 183 10*3/uL (ref 150–400)
RBC: 4.77 MIL/uL (ref 4.22–5.81)
RDW: 13.7 % (ref 11.5–15.5)
WBC: 5.6 10*3/uL (ref 4.0–10.5)

## 2016-07-03 LAB — BASIC METABOLIC PANEL
Anion gap: 6 (ref 5–15)
BUN: 14 mg/dL (ref 6–20)
CALCIUM: 9.2 mg/dL (ref 8.9–10.3)
CO2: 25 mmol/L (ref 22–32)
CREATININE: 0.9 mg/dL (ref 0.61–1.24)
Chloride: 107 mmol/L (ref 101–111)
GFR calc non Af Amer: >60 mL/min (ref >60)
Glucose, Bld: 84 mg/dL (ref 65–99)
Potassium: 3.8 mmol/L (ref 3.5–5.1)
SODIUM: 138 mmol/L (ref 135–145)

## 2016-07-03 LAB — CBG MONITORING, ED: GLUCOSE-CAPILLARY: 91 mg/dL (ref 65–99)

## 2016-07-03 MED ORDER — ONDANSETRON 4 MG PO TBDP: 4.0000 mg | ORAL_TABLET | Freq: Three times a day (TID) | ORAL | 0 refills | Status: DC | PRN

## 2016-07-03 MED ORDER — ONDANSETRON 4 MG PO TBDP
4.0000 mg | ORAL_TABLET | Freq: Once | ORAL | Status: AC
  Administered 2016-07-03: 4 mg via ORAL
  Filled 2016-07-03: qty 1

## 2016-07-03 MED ORDER — MECLIZINE HCL 25 MG PO TABS: 25.0000 mg | ORAL_TABLET | Freq: Three times a day (TID) | ORAL | 0 refills | Status: DC | PRN

## 2016-07-03 MED ORDER — MECLIZINE HCL 25 MG PO TABS
25.0000 mg | ORAL_TABLET | Freq: Once | ORAL | Status: AC
  Administered 2016-07-03: 25 mg via ORAL
  Filled 2016-07-03: qty 1

## 2016-07-03 NOTE — Discharge Instructions (Signed)
Follow-up outpatient with neurology as soon as able
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
## 2016-07-03 NOTE — Progress Notes (Signed)
EDCM spoke to patient at bedside. Patient confirms he [place] have a pcp [place]  [facility], informed patient of services there and walk in times.  EDCM also provided patient with list of pcps who accept self pay patients, list of discount pharmacies and websites needymeds.org and GoodRX.com for medication assistance, phone number to inquire about the orange card, phone number to inquire about Mediciad, phone number to inquire about the Affordable Care Act, financial resources in the community such as local churches, salvation army, urban ministries, and dental assistance for uninsured patients.  Patient thankful for resources.  No further EDCM needs at this time.  Patient [facility] permission to [facility] in efforts to obtain an appointment.

## 2016-07-03 NOTE — ED Notes (Signed)
Bladder Scan: <23434ml
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
## 2016-07-06 ENCOUNTER — Telehealth: Payer: Self-pay

## 2016-07-06 NOTE — Telephone Encounter (Signed)
Message received from Radford PaxAmy Ferrero, RN CM requesting a hospital follow up appointment for the patient. Call placed to # [phone] and a HIPAA compliant voicemail message was left requesting a call back to # [phone] or [phone].   Update provided to A. Bennie DallasFerrero, RN CM

## 2016-07-16 ENCOUNTER — Ambulatory Visit (INDEPENDENT_AMBULATORY_CARE_PROVIDER_SITE_OTHER): Payer: Self-pay | Admitting: Neurology

## 2016-07-16 ENCOUNTER — Encounter: Payer: Self-pay | Admitting: Neurology

## 2016-07-16 DIAGNOSIS — F419 Anxiety disorder, unspecified: Secondary | ICD-10-CM | POA: Insufficient documentation

## 2016-07-16 MED ORDER — CITALOPRAM HYDROBROMIDE 40 MG PO TABS: 40.0000 mg | ORAL_TABLET | Freq: Every day | ORAL | 6 refills | Status: DC

## 2016-07-16 MED ORDER — CITALOPRAM HYDROBROMIDE 40 MG PO TABS: 40.0000 mg | ORAL_TABLET | Freq: Every day | ORAL | 6 refills | Status: AC
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
## 2017-01-26 ENCOUNTER — Emergency Department (HOSPITAL_COMMUNITY)
Admission: EM | Admit: 2017-01-26 | Discharge: 2017-01-26 | Disposition: A | Discharge status: Home / Self Care | Payer: Self-pay | Attending: Physician Assistant | Admitting: Physician Assistant

## 2017-01-26 ENCOUNTER — Encounter (HOSPITAL_COMMUNITY): Payer: Self-pay | Admitting: Emergency Medicine

## 2017-01-26 ENCOUNTER — Emergency Department (HOSPITAL_COMMUNITY): Payer: Self-pay

## 2017-01-26 DIAGNOSIS — G4489 Other headache syndrome: Secondary | ICD-10-CM | POA: Insufficient documentation

## 2017-01-26 DIAGNOSIS — F17210 Nicotine dependence, cigarettes, uncomplicated: Secondary | ICD-10-CM | POA: Insufficient documentation

## 2017-01-26 MED ORDER — MECLIZINE HCL 12.5 MG PO TABS: 12.5000 mg | ORAL_TABLET | Freq: Three times a day (TID) | ORAL | 0 refills | Status: AC | PRN

## 2017-01-26 NOTE — ED Triage Notes (Addendum)
Pt c/o worsening of nausea, anterior head pressure, forgetfulness, dizziness, difficulty sleeping, intermittent double vision x several weeks. Could not remember his fiance's name last night, has been unable to think of names of people or things, which is not usual for him. Has had these symptoms intermittently x years but current symptoms are worse than usual. Has arachnoid cyst on left frontal lobe, has been told it was benign. Left flank pain with intermittent dysuria x several weeks, has been told he has "perpetual kidney stones."

## 2017-01-26 NOTE — Discharge Instructions (Signed)
We are unsure what is causing all of your symptoms. We offered a CAT scan but you did not feel like you wanted to get the CAT scan done. You are welcome back at any time if you would like it.  Otherwise please follow-up with her primary care physician. We've given you a list down below of the primary physician in the area. Please return if you have any concerning symptoms such as weakness, numbness, worsening headache. it.  To find a primary care or specialty doctor please call [phone] or [phone] to access "[place] Find a Doctor Service."  You may also go on the [facility] website at [email or web address]  There are also multiple [facility] [phone] throughout the [place] that are frequently accepting new patients. You may find a clinic that is close to your home and contact them.  [facility] -  [address] [place] [phone]  [facility] in [place] (also locations in [place] and [place]) -  [address] [place] [address] [phone]  [facility] -  [address] [place] [address] [phone]

## 2017-01-26 NOTE — ED Notes (Signed)
Fiance states that pt has decided not to have CT scan and would prefer to follow up outpatient medical.  Notified MD.

## 2017-01-26 NOTE — ED Provider Notes (Signed)
[facility]-EMERGENCY DEPT Provider Note   CSN: [id number] Arrival date & time: 01/26/17  1327     History   Chief Complaint Chief Complaint  Patient presents with  . Headache  . Nausea    HPI Sean Rios is a 31 y.o. male.  Patient is a 31 yo male presenting with headache. Patient's headache has been going on for a number of years. Patient has occasional pressure occasional forgetfullness,  occasional dizziness. Patient knows of a "benign cyst" that on his hea CT a number of years back. He has had mulitple images of this in the past.  He reports that he can "feel when the cyst gets worse". Patient's been seen multiple times the emergency department but has not had a primary care follow-up. Patient also complains of difficulty sleeping, occasional changes in appetitie.  These all been occurring over number of years. Patient does not have daily morning headaches. He reports the headaches are intermittent throughout the day and occasional. denies HI or SI. Reason he came today since is going on for years was becasue he missed work because of it today.   a HPI  Past Medical History:  Diagnosis Date  . Anemia   . Anxiety   . Arachnoid cyst   . Depression   . Heroin abuse   . IBS (irritable bowel syndrome)   . Kidney stone   . Migraine   . Opiate misuse     Patient Active Problem List   Diagnosis Date Noted  . Anxiety 07/16/2016    History reviewed. No pertinent surgical history.     Home Medications    Prior to Admission medications   Medication Sig Start Date End Date Taking? Authorizing Provider  bismuth subsalicylate (PEPTO BISMOL) 262 MG chewable tablet Chew 262-524 mg by mouth as needed (for nausea).   Yes [provider]  citalopram (CELEXA) 40 MG tablet Take 1 tablet (40 mg total) by mouth daily. 07/16/16  Yes Levert FeinsteinYan, Yijun, MD  meclizine (ANTIVERT) 25 MG tablet Take 1 tablet (25 mg total) by mouth 3 (three) times daily as needed for dizziness. 07/03/16  Yes  Rolland PorterJames, Mark, MD  ondansetron (ZOFRAN ODT) 4 MG disintegrating tablet Take 1 tablet (4 mg total) by mouth every 8 (eight) hours as needed for nausea. 07/03/16  Yes Rolland PorterJames, Mark, MD  zolpidem (AMBIEN) 5 MG tablet Take 1 tablet (5 mg total) by mouth at bedtime as needed for sleep. 01/20/16  Yes Pricilla LovelessGoldston, Scott, MD  citalopram (CELEXA) 20 MG tablet Take 1 tablet (20 mg total) by mouth daily. Patient not taking: Reported on 07/16/2016 04/24/16   Tharon AquasPatrick, Frank C, PA  promethazine (PHENERGAN) 25 MG tablet Take 1 tablet (25 mg total) by mouth every 6 (six) hours as needed for nausea or vomiting. Patient not taking: Reported on 07/16/2016 01/20/16   Pricilla LovelessGoldston, Scott, MD    Family History Family History  Problem Relation Age of Onset  . Breast cancer Mother   . Cervical cancer Mother   . Depression Mother   . Depression Father   . Breast cancer Maternal Grandmother   . Depression Maternal Grandmother     Social History Social History  Substance Use Topics  . Smoking status: Current Some Day Smoker    Packs/day: 0.25    Types: Cigarettes  . Smokeless tobacco: Never Used  . Alcohol use No     Comment: Quit 01/11/16     Allergies   Aspirin; Food; Ibuprofen; Naproxen; and Tylenol [acetaminophen]   Review  of Systems Review of Systems  Constitutional: Positive for fatigue. Negative for appetite change and fever.  HENT: Negative for congestion, hearing loss and voice change.   Respiratory: Positive for shortness of breath. Negative for chest tightness.   Cardiovascular: Negative for chest pain.  Skin: Negative for rash.  Neurological: Positive for dizziness, weakness and headaches. Negative for tremors and syncope.  All other systems reviewed and are negative.    Physical Exam Updated Vital Signs BP 116/72 (BP Location: Left Arm)   Pulse 70   Resp 16   SpO2 98%   Physical Exam  Constitutional: He is oriented to person, place, and time. He appears well-nourished.  HENT:  Head:  Normocephalic.  Eyes: Conjunctivae are normal.  Cardiovascular: Normal rate and regular rhythm.   Pulmonary/Chest: Effort normal. No respiratory distress. He has no wheezes.  Neurological: He is oriented to person, place, and time.  Equal strength bilaterally upper and lower extremities negative pronator drift. Normal sensation bilaterally. Speech comprehensible, no slurring. Facial nerve tested and appears grossly normal. Alert and oriented 3.   Skin: Skin is warm and dry. He is not diaphoretic.  Psychiatric: He has a normal mood and affect. His behavior is normal.     ED Treatments / Results  Labs (all labs ordered are listed, but only abnormal results are displayed) Labs Reviewed - No data to display  EKG  EKG Interpretation None       Radiology No results found.  Procedures Procedures (including critical care time)  Medications Ordered in ED Medications - No data to display   Initial Impression / Assessment and Plan / ED Course  I have reviewed the triage vital signs and the nursing notes.  Pertinent labs & imaging results that were available during my care of the patient were reviewed by me and considered in my medical decision making (see chart for details).    A well-appearing 31 year old male presenting with years of headache. Patient reports that he has on and off headaches for years including forgetfulness dizziness and difficulty sleeping. He reports that a Critically worse the last couple days. He states that he's been having increasing forgetfulness and difficulty sleeping. Patient reports he missed work today and so is here for workup due to these headaches. Patient currently does not have a headache. We'll get CT to make sure there is no evidence of large mass. Otherwise given the fact that this going on for a number of years, will have him follow-up with primary care physician.  He is no evidence of cranial nerve defects, is ambulatory and appears  baseline.  We talked how some his complaints could be due to difficulty sleeping; or be due to mood disorders.  Final Clinical Impressions(s) / ED Diagnoses   Final diagnoses:  None    New Prescriptions New Prescriptions   No medications on file     Abelino Derrick, MD 01/26/17 1534

## 2017-04-02 ENCOUNTER — Emergency Department (HOSPITAL_COMMUNITY): Payer: Self-pay

## 2017-04-02 ENCOUNTER — Emergency Department (HOSPITAL_COMMUNITY)
Admission: EM | Admit: 2017-04-02 | Discharge: 2017-04-03 | Disposition: A | Discharge status: Home / Self Care | Payer: Self-pay | Attending: Emergency Medicine | Admitting: Emergency Medicine

## 2017-04-02 DIAGNOSIS — K921 Melena: Secondary | ICD-10-CM | POA: Insufficient documentation

## 2017-04-02 DIAGNOSIS — W51XXXA Accidental striking against or bumped into by another person, initial encounter: Secondary | ICD-10-CM | POA: Insufficient documentation

## 2017-04-02 DIAGNOSIS — S9032XA Contusion of left foot, initial encounter: Secondary | ICD-10-CM | POA: Insufficient documentation

## 2017-04-02 DIAGNOSIS — Y939 Activity, unspecified: Secondary | ICD-10-CM | POA: Insufficient documentation

## 2017-04-02 DIAGNOSIS — Y929 Unspecified place or not applicable: Secondary | ICD-10-CM | POA: Insufficient documentation

## 2017-04-02 DIAGNOSIS — Y999 Unspecified external cause status: Secondary | ICD-10-CM | POA: Insufficient documentation

## 2017-04-02 DIAGNOSIS — Z79899 Other long term (current) drug therapy: Secondary | ICD-10-CM | POA: Insufficient documentation

## 2017-04-02 DIAGNOSIS — R740 Nonspecific elevation of levels of transaminase and lactic acid dehydrogenase [LDH]: Secondary | ICD-10-CM | POA: Insufficient documentation

## 2017-04-02 DIAGNOSIS — F17210 Nicotine dependence, cigarettes, uncomplicated: Secondary | ICD-10-CM | POA: Insufficient documentation

## 2017-04-02 DIAGNOSIS — R7401 Elevation of levels of liver transaminase levels: Secondary | ICD-10-CM

## 2017-04-02 LAB — COMPREHENSIVE METABOLIC PANEL
ALT: 172 U/L — ABNORMAL HIGH (ref 17–63)
ANION GAP: 8 (ref 5–15)
AST: 88 U/L — ABNORMAL HIGH (ref 15–41)
Albumin: 4.6 g/dL (ref 3.5–5.0)
Alkaline Phosphatase: 55 U/L (ref 38–126)
BILIRUBIN TOTAL: 0.5 mg/dL (ref 0.3–1.2)
BUN: 11 mg/dL (ref 6–20)
CO2: 28 mmol/L (ref 22–32)
Calcium: 9.4 mg/dL (ref 8.9–10.3)
Chloride: 102 mmol/L (ref 101–111)
Creatinine, Ser: 0.85 mg/dL (ref 0.61–1.24)
Glucose, Bld: 88 mg/dL (ref 65–99)
POTASSIUM: 4.1 mmol/L (ref 3.5–5.1)
Sodium: 138 mmol/L (ref 135–145)
TOTAL PROTEIN: 7.8 g/dL (ref 6.5–8.1)

## 2017-04-02 LAB — CBC
HEMATOCRIT: 41.9 % (ref 39.0–52.0)
HEMOGLOBIN: 14.7 g/dL (ref 13.0–17.0)
MCH: 28.9 pg (ref 26.0–34.0)
MCHC: 35.1 g/dL (ref 30.0–36.0)
MCV: 82.3 fL (ref 78.0–100.0)
PLATELETS: 172 10*3/uL (ref 150–400)
RBC: 5.09 MIL/uL (ref 4.22–5.81)
RDW: 12.7 % (ref 11.5–15.5)
WBC: 5 10*3/uL (ref 4.0–10.5)

## 2017-04-02 LAB — POC OCCULT BLOOD, ED: Fecal Occult Bld: NEGATIVE

## 2017-04-02 NOTE — ED Triage Notes (Signed)
Pt intially c/o of left foot pain  Then added rectal bleeding x1.5 weeks  States Hx of rectal bleeding after NSAID but denies taking NSAIDs recently

## 2017-04-02 NOTE — ED Provider Notes (Signed)
[facility]EMERGENCY DEPT Provider Note   CSN: [id number] Arrival date & time: 04/02/17  2035    History   Chief Complaint Chief Complaint  Patient presents with  . Rectal Bleeding  . Foot Pain    HPI Sean Rios is a 31 y.o. male.  31 year old male presents to the emergency department for multiple complaints. He first reports onset of pain in his left foot 2 days ago. Symptoms began after his girlfriend fell on his foot. He states that pain is aching and aggravated with weightbearing. He also notes pain when palpating the lateral aspect of his left foot. He has not taken any Tylenol or ibuprofen as this has a history of aggravating his stomach. No other remedies tried prior to arrival.  Patient also complaining of hematochezia. He notes bright red blood mixed with brown stool over the past 1.5 weeks. Symptoms are intermittent. He notes constipation recently, but states that symptoms are not solely associated with constipated bowel movements. He states that he has had an issue with hematochezia in the past. Patient describes some mild associated, generalized abdominal discomfort. He has not had any melena or passage of blood clots per rectum. Patient further denies fevers, nausea, vomiting. He requests not to be given narcotic pain medication while in the emergency department as he has a history of opiate misuse and heroin abuse.   The history is provided by the patient. No language interpreter was used.  Rectal Bleeding  Foot Pain     Past Medical History:  Diagnosis Date  . Anemia   . Anxiety   . Arachnoid cyst   . Depression   . Heroin abuse   . IBS (irritable bowel syndrome)   . Kidney stone   . Migraine   . Opiate misuse     Patient Active Problem List   Diagnosis Date Noted  . Anxiety 07/16/2016    No past surgical history on file.     Home Medications    Prior to Admission medications   Medication Sig Start Date End Date Taking? Authorizing Provider    acetaminophen (TYLENOL) 500 MG tablet Take 1,000 mg by mouth every 6 (six) hours as needed for mild pain, moderate pain or headache.   Yes [provider]  citalopram (CELEXA) 40 MG tablet Take 1 tablet (40 mg total) by mouth daily. 07/16/16  Yes Levert Feinstein, MD  meclizine (ANTIVERT) 12.5 MG tablet Take 1 tablet (12.5 mg total) by mouth 3 (three) times daily as needed for dizziness. 01/26/17  Yes Mackuen, Courteney Lyn, MD  zolpidem (AMBIEN) 5 MG tablet Take 1 tablet (5 mg total) by mouth at bedtime as needed for sleep. Patient not taking: Reported on 04/02/2017 01/20/16   Pricilla Loveless, MD    Family History Family History  Problem Relation Age of Onset  . Breast cancer Mother   . Cervical cancer Mother   . Depression Mother   . Depression Father   . Breast cancer Maternal Grandmother   . Depression Maternal Grandmother     Social History Social History  Substance Use Topics  . Smoking status: Current Some Day Smoker    Packs/day: 0.25    Types: Cigarettes  . Smokeless tobacco: Never Used  . Alcohol use No     Comment: Quit 01/11/16     Allergies   Aspirin; Food; Ibuprofen; Naproxen; and Tylenol [acetaminophen]   Review of Systems Review of Systems  Gastrointestinal: Positive for hematochezia.  Ten systems reviewed and are negative for acute change,  except as noted in the HPI.    Physical Exam Updated Vital Signs BP 118/76 (BP Location: Right Arm)   Pulse 74   Temp 98.6 F (37 C) (Oral)   Resp 14   Ht 6\' 2"  (1.88 m)   Wt 62.6 kg (138 lb)   SpO2 97%   BMI 17.72 kg/m   Physical Exam  Constitutional: He is oriented to person, place, and time. He appears well-developed and well-nourished. No distress.  Nontoxic and in NAD  HENT:  Head: Normocephalic and atraumatic.  Eyes: Conjunctivae and EOM are normal. No scleral icterus.  Neck: Normal range of motion.  Cardiovascular: Normal rate, regular rhythm and intact distal pulses.   DP pulse 2+ in the LLE   Pulmonary/Chest: Effort normal. No respiratory distress. He has no wheezes.  Respirations even and unlabored  Genitourinary:  Genitourinary Comments: Exam chaperoned by HydrologistN tech. No anal fissure. Normal rectal tone. No hemorrhoids noted.  Musculoskeletal: Normal range of motion. He exhibits tenderness.       Feet:  TTP to left lateral foot without crepitus or deformity. No edema or erythema.  Neurological: He is alert and oriented to person, place, and time. He exhibits normal muscle tone. Coordination normal.  GCS 15. Speech is goal oriented; moving all extremities. Sensation to light touch intact. Patient able to wiggle all toes.  Skin: Skin is warm and dry. No rash noted. He is not diaphoretic. No erythema. No pallor.  Psychiatric: He has a normal mood and affect. His behavior is normal.  Nursing note and vitals reviewed.    ED Treatments / Results  Labs (all labs ordered are listed, but only abnormal results are displayed) Labs Reviewed  COMPREHENSIVE METABOLIC PANEL - Abnormal; Notable for the following:       Result Value   AST 88 (*)    ALT 172 (*)    All other components within normal limits  CBC  POC OCCULT BLOOD, ED  TYPE AND SCREEN  ABO/RH    EKG  EKG Interpretation None       Radiology Dg Foot Complete Left  Result Date: 04/02/2017 CLINICAL DATA:  Persistent lateral left foot pain after another person tripped over his foot 2-3 days ago. EXAM: LEFT FOOT - COMPLETE 3+ VIEW COMPARISON:  None. FINDINGS: There is no evidence of fracture or dislocation. There is no evidence of arthropathy or other focal bone abnormality. Soft tissues are unremarkable. IMPRESSION: Negative. Electronically Signed   By: Ellery Plunkaniel R Mitchell M.D.   On: 04/02/2017 23:26    Procedures Procedures (including critical care time)  Medications Ordered in ED Medications - No data to display   Initial Impression / Assessment and Plan / ED Course  I have reviewed the triage vital signs and  the nursing notes.  Pertinent labs & imaging results that were available during my care of the patient were reviewed by me and considered in my medical decision making (see chart for details).     31 year old male presents to the emergency department with multiple complaints. He primarily complains of left foot pain after his girlfriend fell on his foot 2 days ago. He is neurovascularly intact. X-ray negative for fracture, dislocation, or bony deformity. Symptoms consistent with contusion. Will manage supportively with topical Voltaren gel as well as Ace wrapping. Icing advised.  Patient with further complaints of hematochezia 1.5 weeks. He has had a history of similar symptoms. No hemorrhoid or anal fissure appreciated on exam. Hemoccult is negative. Vital signs stable and  patient without anemia. BUN reassuring. Doubt emergent cause of hematochezia given reassuring labs and chronicity of symptoms. Will refer to GI for follow up. Have advised against the use of oral NSAIDs. Patient also advised to avoid tylenol given mild transaminitis. He has been told to have his LFTs rechecked by a primary care doctor. Patient verbalizes understanding. Return precautions discussed and provided. Patient discharged in stable condition with no unaddressed concerns.   Final Clinical Impressions(s) / ED Diagnoses   Final diagnoses:  Contusion of left foot, initial encounter  Hematochezia  Transaminitis    New Prescriptions Discharge Medication List as of 04/03/2017  2:24 AM       Antony MaduraHumes, Sullivan Jacuinde, PA-C 04/03/17 [id number]    Tilden Fossaees, Elizabeth, MD 04/04/17 [id number]

## 2017-04-03 LAB — TYPE AND SCREEN
ABO/RH(D): O POS
Antibody Screen: NEGATIVE

## 2017-04-03 LAB — ABO/RH: ABO/RH(D): O POS

## 2017-04-03 MED ORDER — DICLOFENAC SODIUM 1 % TD GEL
4.0000 g | Freq: Four times a day (QID) | TRANSDERMAL | Status: DC
  Administered 2017-04-03: 4 g via TOPICAL
  Filled 2017-04-03: qty 100

## 2017-04-03 NOTE — Discharge Instructions (Signed)
We recommend the use of Voltaren gel massaged into your foot to prevent inflammation. Keep it wrapped for stability with an Ace wrap. Continue with icing 3-4 times per day and elevation as much as possible. Your liver tests were slightly elevated today. We advise that you avoid Tylenol. Have these tests rechecked in 1-2 weeks. We also recommend follow-up with a gastroenterologist as soon as you are able. You may return to the emergency department for new or concerning symptoms.

## 2017-04-05 ENCOUNTER — Telehealth: Payer: Self-pay | Admitting: Emergency Medicine

## 2017-04-05 NOTE — Telephone Encounter (Signed)
CM consulted for pt with no PCP and no ins.  Called pt to offer indigent clinic appointment with no answer. LVM asking for a return call from pt.  CM will follow assist if pt returns call.

## 2017-04-05 NOTE — Telephone Encounter (Signed)
Pt returned CM phone call and was interested in help with establishing a PCP.  Pt [facility] the [facility] out of the 3 clinic options.  Appointment was scheduled for Monday Aug 6th at 9:30 am.  Called pt back with appointment time, location, and phone number.  No further CM needs noted at this time.

## 2017-04-12 ENCOUNTER — Encounter: Payer: Self-pay | Admitting: Family Medicine

## 2017-04-12 ENCOUNTER — Ambulatory Visit (INDEPENDENT_AMBULATORY_CARE_PROVIDER_SITE_OTHER): Payer: Self-pay | Admitting: Family Medicine

## 2017-04-12 VITALS — BP 110/72 | HR 63 | Temp 97.8°F | Resp 14 | Ht 74.0 in | Wt 141.0 lb

## 2017-04-12 DIAGNOSIS — F1911 Other psychoactive substance abuse, in remission: Secondary | ICD-10-CM

## 2017-04-12 DIAGNOSIS — R7989 Other specified abnormal findings of blood chemistry: Secondary | ICD-10-CM

## 2017-04-12 DIAGNOSIS — K0889 Other specified disorders of teeth and supporting structures: Secondary | ICD-10-CM

## 2017-04-12 DIAGNOSIS — R768 Other specified abnormal immunological findings in serum: Secondary | ICD-10-CM

## 2017-04-12 DIAGNOSIS — R945 Abnormal results of liver function studies: Secondary | ICD-10-CM

## 2017-04-12 DIAGNOSIS — K029 Dental caries, unspecified: Secondary | ICD-10-CM

## 2017-04-12 DIAGNOSIS — Z8619 Personal history of other infectious and parasitic diseases: Secondary | ICD-10-CM

## 2017-04-12 DIAGNOSIS — G930 Cerebral cysts: Secondary | ICD-10-CM

## 2017-04-12 LAB — CBC WITH DIFFERENTIAL/PLATELET
BASOS ABS: 0 {cells}/uL (ref 0–200)
Basophils Relative: 0 %
EOS PCT: 6 %
Eosinophils Absolute: 312 cells/uL (ref 15–500)
HCT: 43.9 % (ref 38.5–50.0)
HEMOGLOBIN: 14.7 g/dL (ref 13.2–17.1)
LYMPHS ABS: 1924 {cells}/uL (ref 850–3900)
Lymphocytes Relative: 37 %
MCH: 28.3 pg (ref 27.0–33.0)
MCHC: 33.5 g/dL (ref 32.0–36.0)
MCV: 84.6 fL (ref 80.0–100.0)
MONO ABS: 416 {cells}/uL (ref 200–950)
MONOS PCT: 8 %
MPV: 10 fL (ref 7.5–12.5)
NEUTROS PCT: 49 %
Neutro Abs: 2548 cells/uL (ref 1500–7800)
Platelets: 201 10*3/uL (ref 140–400)
RBC: 5.19 MIL/uL (ref 4.20–5.80)
RDW: 13.9 % (ref 11.0–15.0)
WBC: 5.2 10*3/uL (ref 3.8–10.8)

## 2017-04-12 LAB — HEPATIC FUNCTION PANEL
ALBUMIN: 4.4 g/dL (ref 3.6–5.1)
ALT: 105 U/L — ABNORMAL HIGH (ref 9–46)
AST: 53 U/L — ABNORMAL HIGH (ref 10–40)
Alkaline Phosphatase: 57 U/L (ref 40–115)
BILIRUBIN INDIRECT: 0.4 mg/dL (ref 0.2–1.2)
BILIRUBIN TOTAL: 0.5 mg/dL (ref 0.2–1.2)
Bilirubin, Direct: 0.1 mg/dL (ref <=0.2)
TOTAL PROTEIN: 7.2 g/dL (ref 6.1–8.1)

## 2017-04-12 LAB — COMPLETE METABOLIC PANEL WITH GFR
ALBUMIN: 4.4 g/dL (ref 3.6–5.1)
ALT: 105 U/L — AB (ref 9–46)
AST: 53 U/L — AB (ref 10–40)
Alkaline Phosphatase: 57 U/L (ref 40–115)
BUN: 15 mg/dL (ref 7–25)
CHLORIDE: 104 mmol/L (ref 98–110)
CO2: 23 mmol/L (ref 20–32)
Calcium: 9 mg/dL (ref 8.6–10.3)
Creat: 0.83 mg/dL (ref 0.60–1.35)
GFR, Est African American: >89 mL/min (ref >=60)
GFR, Est Non African American: >89 mL/min (ref >=60)
GLUCOSE: 86 mg/dL (ref 65–99)
POTASSIUM: 4.1 mmol/L (ref 3.5–5.3)
SODIUM: 137 mmol/L (ref 135–146)
Total Bilirubin: 0.5 mg/dL (ref 0.2–1.2)
Total Protein: 7.2 g/dL (ref 6.1–8.1)

## 2017-04-12 LAB — POCT URINALYSIS DIP (DEVICE)
Bilirubin Urine: NEGATIVE
Glucose, UA: NEGATIVE mg/dL
HGB URINE DIPSTICK: NEGATIVE
Ketones, ur: NEGATIVE mg/dL
LEUKOCYTES UA: NEGATIVE
NITRITE: NEGATIVE
PROTEIN: NEGATIVE mg/dL
UROBILINOGEN UA: 0.2 mg/dL (ref 0.0–1.0)
pH: 6 (ref 5.0–8.0)

## 2017-04-12 MED ORDER — AMOXICILLIN 875 MG PO TABS: 875.0000 mg | ORAL_TABLET | Freq: Two times a day (BID) | ORAL | 0 refills | Status: DC

## 2017-04-12 NOTE — Progress Notes (Signed)
Patient ID: Lois HuxleyJames Oltmann, male    DOB: 1986-05-15, 31 y.o.   MRN: [id number]  PCP: Bing NeighborsHarris, Demonte Dobratz S, FNP  Chief Complaint  Patient presents with  . Establish Care  . Hospitalization Follow-up  . Dental Pain    Subjective:  HPI Lois HuxleyJames Herman is a 31 y.o. male presents to establish care and evaluation of dental pain. Medical history significant for Anxiety, Left temporal arachnoid cyst, Hx heroin use, Positive Hepatitis C, Dental Caries Fayrene FearingJames recently [facility] department with a complaint of her left foot pain , rectal bleeding, and abnormal elevated liver enzymes. Fayrene FearingJames reports resolution of left foot pain and he has not experienced any further rectal bleeding. Occult stool test was negative. Fayrene FearingJames also reports a history of hepatitis C for which he has never received treatment. During his most recent emergency department visit, Treavon's liver enzymes were elevated. Today, Fayrene FearingJames complains of dental pain. He has a history of dental caries. He reports that he self extracted one of his teeth however the tooth broke off in his gum. He is uninsured and has not had any dental care or evaluation. Of note, Fayrene FearingJames was diagnosed with left temporal arachnoid cyst per CT , 07/03/2016. He has had no prior neurology follow-up. Social History   Social History  . Marital status: Single    Spouse name: N/A  . Number of children: 2  . Years of education: 12   Occupational History  . [employer]    Social History Main Topics  . Smoking status: Current Some Day Smoker    Packs/day: 0.25    Types: Cigarettes  . Smokeless tobacco: Never Used  . Alcohol use No     Comment: Quit 01/11/16  . Drug use: Yes    Types: IV, Heroin, Other-see comments     Comment: Quit opiates 01/11/16  . Sexual activity: Not on file   Other Topics Concern  . Not on file   Social History Narrative   Lives w/ 6 roommates   Left-handed   Caffeine: 3-5 cups coffee daily    Family History  Problem  Relation Age of Onset  . Breast cancer Mother   . Cervical cancer Mother   . Depression Mother   . Depression Father   . Breast cancer Maternal Grandmother   . Depression Maternal Grandmother    Review of Systems See HPI Patient Active Problem List   Diagnosis Date Noted  . Anxiety 07/16/2016    Allergies  Allergen Reactions  . Aspirin Other (See Comments)    Reaction:  Blood in stool and urine   . Food Itching and Other (See Comments)    Pt states that he is allergic to all fruits with skins.   . Ibuprofen Other (See Comments)    Reaction:  Blood in stool and urine   . Naproxen Other (See Comments)    Reaction:  Blood in stool and urine   . Tylenol [Acetaminophen] Other (See Comments)    Reaction:  Blood in stool and urine     Prior to Admission medications   Medication Sig Start Date End Date Taking? Authorizing Provider  citalopram (CELEXA) 40 MG tablet Take 1 tablet (40 mg total) by mouth daily. 07/16/16  Yes Levert FeinsteinYan, Yijun, MD  meclizine (ANTIVERT) 12.5 MG tablet Take 1 tablet (12.5 mg total) by mouth 3 (three) times daily as needed for dizziness. Patient not taking: Reported on 04/12/2017 01/26/17   Abelino DerrickMackuen, Courteney Lyn, MD  zolpidem (AMBIEN) 5  MG tablet Take 1 tablet (5 mg total) by mouth at bedtime as needed for sleep. Patient not taking: Reported on 04/02/2017 01/20/16   Pricilla Loveless, MD    Past Medical, Surgical Family and Social History reviewed and updated.    Objective:   Today's Vitals   04/12/17 0952  BP: 110/72  Pulse: 63  Resp: 14  Temp: 97.8 F (36.6 C)  TempSrc: Oral  SpO2: 99%  Weight: 141 lb (64 kg)  Height: 6\' 2"  (1.88 m)    Wt Readings from Last 3 Encounters:  04/12/17 141 lb (64 kg)  04/02/17 138 lb (62.6 kg)  07/16/16 144 lb 8 oz (65.5 kg)   Physical Exam  Constitutional: He is oriented to person, place, and time. He appears well-developed and well-nourished.  HENT:  Head: Normocephalic and atraumatic.  Eyes: Pupils are equal,  round, and reactive to light. Conjunctivae and EOM are normal. Scleral icterus is present.  Neck: Normal range of motion. Neck supple. No thyromegaly present.  Cardiovascular: Normal rate, regular rhythm, normal heart sounds and intact distal pulses.   Pulmonary/Chest: Effort normal and breath sounds normal.  Abdominal: Soft. Bowel sounds are normal. He exhibits no mass. There is no tenderness. There is no rebound and no guarding.  Musculoskeletal: Normal range of motion.  Neurological: He is alert and oriented to person, place, and time.  Psychiatric: He has a normal mood and affect. His behavior is normal. Judgment and thought content normal.    Assessment & Plan:  1. Intravenous drug abuse in remission -Continue substance abuse rehabilitation   2. Pain, dental -Amoxicillin 875 mg, 2 times daily x 10 days -Seek dental evaluation   3. Hx of hepatitis - Hepatic Function Panel - Hepatitis C antibody  4. Abnormal liver function test - US Abdomen Limited RUQ; Future - COMPLETE METABOLIC PANEL WITH GFR - CBC with Differential  5. Hepatitis C antibody test positive - Hepatitis C RNA quantitative  6. Elevated LFTs - POCT urinalysis dip (device)  7. Dental caries -Amoxicillin 875 mg, 2 times daily x 10 days -Seek dental evaluation   8. Arachnoid cyst -Ambulatory referral neurology   Upon receipt of a positive hepatitis C antibody test and abdominal RUQ ultrasound , will refer patient to infectious disease and gastroenterology for further evaluation.   Patient given [facility] Patient Assistance Application.  RTC: 3 months for routine follow-up and repeat liver enzymes   Godfrey Pick. Tiburcio Pea, MSN, FNP-C [facility]  [address] [place], [place] [address] [phone]

## 2017-04-12 NOTE — Patient Instructions (Signed)
Hepatitis C Hepatitis C is a liver infection. It is caused by a germ that can spread through blood and other bodily fluids. Your doctor will use blood and liver tests to:  Check for this infection.  Decide how to treat you.  Check your health after treatment.  Follow these instructions at home:  Rest.  Do not take any medicine unless your doctor says it is okay. This includes over-the-counter medicine and birth control pills.  Do not drink alcohol.  Do not have sex until your doctor says it is okay.  Do not share toothbrushes, nail clippers, razors, or needles.  Take all medicines as told by your doctor. Contact a doctor if:  You have a fever.  Your belly (abdomen) hurts.  Your pee (urine) is dark.  Your poop (bowel movement) is the color of clay.  You have joint pain. Get help right away if:  You feel more and more tired (fatigued).  You feel more and more weak.  You do not feel like eating.  You feel sick to your stomach (nauseous) or throw up (vomit).  Your skin or the whites of your eyes turn yellow (jaundice) or turn more yellow than they were before.  You bruise or bleed easily. This information is not intended to replace advice given to you by your health care provider. Make sure you discuss any questions you have with your health care provider. Document Released: 08/06/2008 Document Revised: 01/30/2016 Document Reviewed: 12/06/2013 Elsevier Interactive Patient Education  2017 Elsevier Inc.  

## 2017-04-13 LAB — HEPATITIS C ANTIBODY: HCV Ab: REACTIVE — AB

## 2017-04-14 LAB — HEPATITIS C RNA QUANTITATIVE

## 2017-04-15 DIAGNOSIS — B182 Chronic viral hepatitis C: Secondary | ICD-10-CM | POA: Insufficient documentation

## 2017-04-15 DIAGNOSIS — K029 Dental caries, unspecified: Secondary | ICD-10-CM | POA: Insufficient documentation

## 2017-04-15 DIAGNOSIS — R7989 Other specified abnormal findings of blood chemistry: Secondary | ICD-10-CM | POA: Insufficient documentation

## 2017-04-15 DIAGNOSIS — R945 Abnormal results of liver function studies: Secondary | ICD-10-CM

## 2017-04-16 ENCOUNTER — Telehealth: Payer: Self-pay

## 2017-04-16 ENCOUNTER — Ambulatory Visit (HOSPITAL_COMMUNITY)
Admission: RE | Admit: 2017-04-16 | Discharge: 2017-04-16 | Disposition: A | Discharge status: Home / Self Care | Payer: Self-pay | Source: Ambulatory Visit | Attending: Family Medicine | Admitting: Family Medicine

## 2017-04-16 DIAGNOSIS — R945 Abnormal results of liver function studies: Secondary | ICD-10-CM | POA: Insufficient documentation

## 2017-04-16 DIAGNOSIS — R7989 Other specified abnormal findings of blood chemistry: Secondary | ICD-10-CM

## 2017-04-16 DIAGNOSIS — K824 Cholesterolosis of gallbladder: Secondary | ICD-10-CM | POA: Insufficient documentation

## 2017-04-16 NOTE — Telephone Encounter (Signed)
Patient notified and would like to have a application mailed out to him. Will put in mail bin today.

## 2017-04-16 NOTE — Telephone Encounter (Signed)
-----   Message from Bing NeighborsKimberly S Harris, FNP sent at 04/15/2017  9:35 PM EDT ----- Contact patient to advise that I am referring him to neurology for evaluation of left temporal arachnoid cyst dx 2017. He will need to complete the [facility] health application and submit prior to visit.

## 2017-04-19 ENCOUNTER — Encounter: Payer: Self-pay | Admitting: Family Medicine

## 2017-04-20 ENCOUNTER — Telehealth: Payer: Self-pay | Admitting: Family Medicine

## 2017-04-20 NOTE — Telephone Encounter (Signed)
Referral for neurology cancelled as patient has already had evaluation [facility] and has no new symptoms.

## 2017-04-23 ENCOUNTER — Other Ambulatory Visit: Payer: Self-pay | Admitting: Family Medicine

## 2017-04-23 ENCOUNTER — Other Ambulatory Visit (INDEPENDENT_AMBULATORY_CARE_PROVIDER_SITE_OTHER): Payer: Self-pay

## 2017-04-23 DIAGNOSIS — Z8619 Personal history of other infectious and parasitic diseases: Secondary | ICD-10-CM

## 2017-04-23 DIAGNOSIS — R768 Other specified abnormal immunological findings in serum: Secondary | ICD-10-CM

## 2017-04-23 NOTE — Progress Notes (Signed)
Repeat hepatitis C as previous sample was insufficient to complete testing.

## 2017-04-24 LAB — HEPATITIS C ANTIBODY: HCV AB: REACTIVE — AB

## 2017-04-27 LAB — HEPATITIS C RNA QUANTITATIVE
HCV QUANT LOG: 5.56 {Log_IU}/mL — AB
HCV QUANT: 365000 [IU]/mL — AB

## 2017-05-02 ENCOUNTER — Encounter: Payer: Self-pay | Admitting: Family Medicine

## 2017-05-02 NOTE — Progress Notes (Signed)
Referral to ID for hepatitis C infection.

## 2017-05-02 NOTE — Addendum Note (Signed)
Addended by: Bing Neighbors on: 05/02/2017 08:54 PM   Modules accepted: Orders

## 2017-05-12 ENCOUNTER — Encounter: Payer: Self-pay | Admitting: Internal Medicine

## 2017-05-12 ENCOUNTER — Ambulatory Visit (INDEPENDENT_AMBULATORY_CARE_PROVIDER_SITE_OTHER): Payer: Self-pay | Admitting: Internal Medicine

## 2017-05-12 VITALS — BP 122/80 | HR 73 | Temp 98.0°F | Ht 75.0 in | Wt 140.0 lb

## 2017-05-12 DIAGNOSIS — B182 Chronic viral hepatitis C: Secondary | ICD-10-CM

## 2017-05-12 MED ORDER — SOFOSBUVIR-VELPATASVIR 400-100 MG PO TABS: 1.0000 | ORAL_TABLET | Freq: Every day | ORAL | 2 refills | Status: DC

## 2017-05-12 NOTE — Progress Notes (Signed)
[facility]   CC: consideration for treatment for chronic hepatitis C  HPI:  +Sean Rios is a 31 y.o. male who presents for initial evaluation and management of chronic hepatitis C.  Patient tested positive last month during risk factor screening. Hepatitis C-associated risk factors present are: IV drug abuse (details: last used 18 months ago). Patient denies tattoos. Patient has had other studies performed. Results: hepatitis C RNA by PCR, result: positive. Patient has not had prior treatment for Hepatitis C. Patient does not have a past history of liver disease. Patient does not have a family history of liver disease. Patient does not  have associated signs or symptoms related to liver disease.  Labs reviewed and confirm chronic hepatitis C with a positive viral load.   Records reviewed from Epic, he is followed at [facility] for PCP.  I reviewed previous LFTs and intermittent inflammation.        Patient does not have documented immunity to Hepatitis A. Patient does not have documented immunity to Hepatitis B.    Review of Systems:  Constitutional: negative for fatigue and malaise Gastrointestinal: negative for diarrhea Integument/breast: negative for rash All other systems reviewed and are negative       Past Medical History:  Diagnosis Date  . Anemia   . Anxiety   . Arachnoid cyst   . Depression   . Heroin abuse   . IBS (irritable bowel syndrome)   . Kidney stone   . Migraine   . Opiate misuse     Prior to Admission medications   Medication Sig Start Date End Date Taking? Authorizing Provider  amoxicillin (AMOXIL) 875 MG tablet Take 1 tablet (875 mg total) by mouth 2 (two) times daily. 04/12/17   Bing Neighbors, FNP  citalopram (CELEXA) 40 MG tablet Take 1 tablet (40 mg total) by mouth daily. 07/16/16   Levert Feinstein, MD  meclizine (ANTIVERT) 12.5 MG tablet Take 1 tablet (12.5 mg total) by mouth 3 (three) times daily as needed for dizziness. Patient  not taking: Reported on 04/12/2017 01/26/17   Mackuen, Courteney Lyn, MD  zolpidem (AMBIEN) 5 MG tablet Take 1 tablet (5 mg total) by mouth at bedtime as needed for sleep. Patient not taking: Reported on 04/02/2017 01/20/16   Pricilla Loveless, MD    Allergies  Allergen Reactions  . Aspirin Other (See Comments)    Reaction:  Blood in stool and urine   . Food Itching and Other (See Comments)    Pt states that he is allergic to all fruits with skins.   . Ibuprofen Other (See Comments)    Reaction:  Blood in stool and urine   . Naproxen Other (See Comments)    Reaction:  Blood in stool and urine   . Tylenol [Acetaminophen] Other (See Comments)    Reaction:  Blood in stool and urine     Social History  Substance Use Topics  . Smoking status: Current Some Day Smoker    Packs/day: 0.25    Types: Cigarettes  . Smokeless tobacco: Never Used  . Alcohol use No     Comment: Quit 01/11/16    Family History  Problem Relation Age of Onset  . Breast cancer Mother   . Cervical cancer Mother   . Depression Mother   . Depression Father   . Breast cancer Maternal Grandmother   . Depression Maternal Grandmother      Objective:  Constitutional: in no apparent distress and non-toxic Blood  pressure 122/80, pulse 73, temperature 98 F (36.7 C), height 6\' 3"  (1.905 m), weight 140 lb (63.5 kg). Eyes: anicteric Cardiovascular: Cor RRR and No murmurs Respiratory: CTA B; normal respiratory effort Gastrointestinal: Bowel sounds are normal, liver is not enlarged, spleen is not enlarged Musculoskeletal: no pedal edema noted Skin: negatives: no rash; no porphyria cutanea tarda Lymphatic: no cervical lymphadenopathy   Laboratory Genotype: No results found for: HCVGENOTYPE HCV viral load:  Lab Results  Component Value Date   HCVQUANT 365,000 (H) 04/23/2017   Lab Results  Component Value Date   WBC 5.2 04/12/2017   HGB 14.7 04/12/2017   HCT 43.9 04/12/2017   MCV 84.6 04/12/2017   PLT 201  04/12/2017    Lab Results  Component Value Date   CREATININE 0.83 04/12/2017   BUN 15 04/12/2017   NA 137 04/12/2017   K 4.1 04/12/2017   CL 104 04/12/2017   CO2 23 04/12/2017    Lab Results  Component Value Date   ALT 105 (H) 04/12/2017   ALT 105 (H) 04/12/2017   AST 53 (H) 04/12/2017   AST 53 (H) 04/12/2017   ALKPHOS 57 04/12/2017   ALKPHOS 57 04/12/2017     Labs and history reviewed and show CHILD-PUGH A  5-6 points: Child class A 7-9 points: Child class B 10-15 points: Child class C  Lab Results  Component Value Date   BILITOT 0.5 04/12/2017   BILITOT 0.5 04/12/2017   ALBUMIN 4.4 04/12/2017   ALBUMIN 4.4 04/12/2017     Assessment: New Patient with Chronic Hepatitis C genotype unknown, untreated.  I discussed with the patient the lab findings that confirm chronic hepatitis C as well as the natural history and progression of disease including about 30% of people who develop cirrhosis of the liver if left untreated and once cirrhosis is established there is a 2-7% risk per year of liver cancer and liver failure.  I discussed the importance of treatment and benefits in reducing the risk, even if significant liver fibrosis exists.   Plan: 1) Patient counseled extensively on limiting acetaminophen to no more than 2 grams daily, avoidance of alcohol. 2) Transmission discussed with patient including sexual transmission, sharing razors and toothbrush.   3) Will need referral to gastroenterology if concern for cirrhosis 4) Will need referral for substance abuse counseling: No.; Further work up to include urine drug screen  No. 5) Will prescribe appropriate medication based on genotype and coverage - uninusured, will use Epclusa 6) Hepatitis A and B titers 7) Further work up to include liver staging with elastography 8) will follow up after starting medication

## 2017-05-12 NOTE — Patient Instructions (Signed)
Date 05/12/17  Dear Mr. Earlene PlaterDavis, As discussed in the ID Clinic, your hepatitis C therapy will include the following medications:    sofosbuvir 400 mg/velpatasvir 100 mg (Epclusa) oral daily  Please note that ALL MEDICATIONS WILL START ON THE SAME DATE for a total of 12 weeks. ---------------------------------------------------------------- Your HCV Treatment Start Date: TBA   Your HCV genotype: unknown    Liver Fibrosis: TBD    ---------------------------------------------------------------- YOUR PHARMACY CONTACT (depending on your insurance):   [facility] [address] [place], [place] [address] Phone: [phone] Hours: Monday to Friday 7:30 am to 6:00 pm   Please always contact your pharmacy at least 3-4 business days before you run out of medications to ensure your next month's medication is ready or 1 week prior to running out if you receive it by mail.  Remember, each prescription is for 28 days. ---------------------------------------------------------------- GENERAL NOTES REGARDING YOUR HEPATITIS C MEDICATION:  SOFOSBUVIR (SOVALDI or EPCLUSA): - Sofosbuvir 400 mg tablet is taken daily with OR without food. - The sofosbuvir tablets are yellow. - The Epclusa tablets are pink, diamond-shaped - The tablets should be stored at room temperature. - The most common side effects with sofosbuvir or Epclusa include:      1. Fatigue      2. Headache      3. Nausea      4. Diarrhea      5. Insomnia  - Acid reducing agents such as H2 blockers (ie. Pepcid (famotidine), Zantac (ranitidine), Tagamet (cimetidine), Axid (nizatidine) and proton pump inhibitors (ie. Prilosec (omeprazole), Protonix (pantoprazole), Nexium (esomeprazole), or Aciphex (rabeprazole)) can decrease effectiveness of Harvoni. Do not take until you have discussed with a health care provider.    -Antacids that contain magnesium and/or aluminum hydroxide (ie. Milk of Magensia, Rolaids, Gaviscon,  Maalox, Mylanta, an dArthritis Pain Formula)can reduce absorption of sofosbuvir, so take them at least 4 hours before or after Harvoni.  -Calcium carbonate (calcium supplements or antacids such as Tums, Caltrate, Os-Cal)needs to be taken at least 4 hours hours before or after sofosbuvir.  -St. John's wort or any products that contain St. John's wort like some herbal supplements  Please inform the office prior to starting any of these medications.   Please note that this only lists the most common side effects and is NOT a comprehensive list of the potential side effects of these medications. For more information, please review the drug information sheets that come with your medication package from the pharmacy.  ---------------------------------------------------------------- GENERAL HELPFUL HINTS ON HCV THERAPY: 1. No alcohol. 2. Stay well-hydrated 3. Notify the ID Clinic of any changes in your other over-the-counter/herbal or prescription medications. 4. If you miss a dose of your medication, take the missed dose as soon as you remember. Return to your regular time/dose schedule the next day.  5.  Do not stop taking your medications without first talking with your healthcare provider. 6.  You may take Tylenol (acetaminophen), as long as the dose is less than 2000 mg (OR no more than 4 tablets of the Tylenol Extra Strengths 500mg  tablet) in 24 hours. 7. You will follow up with our clinic pharmacist initially after starting the medication to monitor for any possible side effects 8. You will get labs once during treatment, soon after treatment completion and again 6 months or more after treatment completion to verify the virus is completely gone.   Staci RighterOMER, ROBERT, MD  [facility] [address]  [place], [place]  [address] [phone]

## 2017-05-15 LAB — LIVER FIBROSIS, FIBROTEST-ACTITEST
ALT: 155 U/L — AB (ref 9–46)
APOLIPOPROTEIN A1: 148 mg/dL (ref 94–176)
Alpha-2-Macroglobulin: 149 mg/dL (ref 106–279)
BILIRUBIN: 0.5 mg/dL (ref 0.2–1.2)
Fibrosis Score: 0.07
GGT: 12 U/L (ref 3–90)
HAPTOGLOBIN: 94 mg/dL (ref 43–212)
Necroinflammat ACT Score: 0.66
Reference ID: 2098212

## 2017-05-20 LAB — HEPATITIS B SURFACE ANTIBODY,QUALITATIVE: Hep B S Ab: BORDERLINE — AB

## 2017-05-20 LAB — HEPATITIS A ANTIBODY, TOTAL: HEPATITIS A AB,TOTAL: NONREACTIVE

## 2017-05-20 LAB — HEPATITIS C GENOTYPE: HCV Genotype: 3

## 2017-05-20 LAB — HCV RNA,QN PCR RFLX GENO, LIPABAD
HCV RNA, PCR, QN (LOG): 6.06 {Log_IU}/mL — AB
HCV RNA, PCR, QN: 1150000 IU/mL — ABNORMAL HIGH

## 2017-05-20 LAB — PROTIME-INR
INR: 1
Prothrombin Time: 10.5 s (ref 9.0–11.5)

## 2017-05-20 LAB — HEPATITIS B CORE ANTIBODY, TOTAL: HEP B C TOTAL AB: NONREACTIVE

## 2017-05-20 LAB — HIV ANTIBODY (ROUTINE TESTING W REFLEX): HIV: NONREACTIVE

## 2017-05-20 LAB — HEPATITIS B SURFACE ANTIGEN: Hepatitis B Surface Ag: NONREACTIVE

## 2017-06-08 ENCOUNTER — Other Ambulatory Visit: Payer: Self-pay | Admitting: Pharmacist

## 2017-06-08 ENCOUNTER — Telehealth: Payer: Self-pay | Admitting: Pharmacist

## 2017-06-08 ENCOUNTER — Encounter: Payer: Self-pay | Admitting: Pharmacy Technician

## 2017-06-08 NOTE — Telephone Encounter (Signed)
Sean Rios started [place] 4 days ago and is complaining of nausea and chest pain.  He said the nausea comes when he takes the medication without food so he will start taking that with food to see if it helps.  I told him I could give him som zofran if not.  Regarding the chest pain, he stated that for about 25 minutes yesterday he just had some non-radiating chest pain.  I told him that Epclusa shouldn't cause chest pain and that if it happened again or if it got severe in nature, he should go to the ED to get it checked out.  He said he would keep an eye on it and do that if needed.

## 2017-07-01 ENCOUNTER — Ambulatory Visit (INDEPENDENT_AMBULATORY_CARE_PROVIDER_SITE_OTHER): Payer: Self-pay | Admitting: Pharmacist

## 2017-07-01 DIAGNOSIS — B182 Chronic viral hepatitis C: Secondary | ICD-10-CM

## 2017-07-01 NOTE — Progress Notes (Signed)
HPI: Sean Rios is a 31 y.o. male who presents to the [facility] pharmacy clinic for Hep C follow-up.  He has genotype 3, F0, and started 12 weeks of Epclusa on 9/28 from [facility].  Lab Results  Component Value Date   HCVGENOTYPE 3 05/12/2017    Allergies: Allergies  Allergen Reactions  . Aspirin Other (See Comments)    Reaction:  Blood in stool and urine   . Food Itching and Other (See Comments)    Pt states that he is allergic to all fruits with skins.   . Ibuprofen Other (See Comments)    Reaction:  Blood in stool and urine   . Naproxen Other (See Comments)    Reaction:  Blood in stool and urine   . Tylenol [Acetaminophen] Other (See Comments)    Reaction:  Blood in stool and urine     Past Medical History: Past Medical History:  Diagnosis Date  . Anemia   . Anxiety   . Arachnoid cyst   . Depression   . Heroin abuse   . IBS (irritable bowel syndrome)   . Kidney stone   . Migraine   . Opiate misuse     Social History: Social History   Social History  . Marital status: Single    Spouse name: N/A  . Number of children: 2  . Years of education: 12   Occupational History  . [employer]    Social History Main Topics  . Smoking status: Current Some Day Smoker    Packs/day: 0.25    Types: Cigarettes  . Smokeless tobacco: Never Used     Comment: "jewel pod"  . Alcohol use No     Comment: Quit 01/11/16  . Drug use: Yes    Types: IV, Heroin, Other-see comments     Comment: Quit opiates 01/11/16  . Sexual activity: Yes    Partners: Female   Other Topics Concern  . Not on file   Social History Narrative   Lives w/ 6 roommates   Left-handed   Caffeine: 3-5 cups coffee daily    Labs: Hep B S Ab (no units)  Date Value  05/12/2017 BORDERLINE (A)   Hepatitis B Surface Ag (no units)  Date Value  05/12/2017 NON-REACTIVE   HCV Ab (no units)  Date Value  04/23/2017 REACTIVE (A)    Lab Results  Component Value Date   HCVGENOTYPE 3 05/12/2017    Hepatitis  C RNA quantitative Latest Ref Rng & Units 04/23/2017 04/12/2017  HCV Quantitative NOT DETECTED IU/mL 365,000(H) CANCELED  HCV Quantitative Log NOT DETECTED Log IU/mL 5.56(H) CANCELED    AST (U/L)  Date Value  04/12/2017 53 (H)  04/12/2017 53 (H)  04/02/2017 88 (H)   ALT (U/L)  Date Value  05/12/2017 155 (H)  04/12/2017 105 (H)  04/12/2017 105 (H)  04/02/2017 172 (H)   INR (no units)  Date Value  05/12/2017 1.0    CrCl: CrCl cannot be calculated (Patient's most recent lab result is older than the maximum 21 days allowed.).  Fibrosis Score: F0 as assessed by fibrosure  Child-Pugh Score: A  Previous Treatment Regimen: None  Assessment: Sean Rios is here today to follow-up for his Hep C infection.  He started 12 weeks of Epclusa around 1 month ago. He just finished his first bottle and is starting on his 2nd bottle tomorrow. He has had no issues receiving his 2nd bottle from [facility].  He denies any side effects except for diarrhea, which  he attributes to his underlying IBS. He is not having any more chest pain/discomfort or nausea. I told him to call me if he has any issues going forward.    He hasn't missed a single dose and takes it before he goes to work.  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  He recalls filling out a [facility] financial assistance applicatoin, but is unsure if he is approved.  He wants to get labs today, so I gave him another [facility] financial assistance application as well as a Sport and exercise psychologist and told him to fill them out.  He will bring them at next visit if he has any questions or issues. Will bring him back in 2 months for EOT visit.    Plans: - Continue Epclusa x 12 weeks - HCV RNA today - F/u with me again 09/13/17 at 930am  Sean Rios, PharmD, CPP Infectious Diseases Clinical Pharmacist [facility] 07/01/2017, 3:10 PM

## 2017-07-03 LAB — HEPATITIS C RNA QUANTITATIVE
HCV QUANT LOG: NOT DETECTED {Log_IU}/mL
HCV RNA, PCR, QN: <15 IU/mL

## 2017-07-13 ENCOUNTER — Ambulatory Visit: Payer: Self-pay | Admitting: Family Medicine

## 2017-09-13 ENCOUNTER — Ambulatory Visit: Payer: Self-pay

## 2017-09-27 ENCOUNTER — Ambulatory Visit: Payer: Self-pay

## 2017-10-04 ENCOUNTER — Ambulatory Visit (INDEPENDENT_AMBULATORY_CARE_PROVIDER_SITE_OTHER): Payer: Self-pay | Admitting: Pharmacist

## 2017-10-04 DIAGNOSIS — B182 Chronic viral hepatitis C: Secondary | ICD-10-CM

## 2017-10-04 NOTE — Addendum Note (Signed)
Addended by: Robinette HainesKUPPELWEISER, CASSIE L on: 10/04/2017 02:44 PM   Modules accepted: Orders

## 2017-10-04 NOTE — Progress Notes (Signed)
[facility] Pharmacy Visit  HPI: Sean Rios is a 32 y.o. male who presents to the [facility] pharmacy clinic for follow-up of his Hep C infection. He has genotype 3, F0, and completed 12 weeks of Epclusa around Christmas time.  Patient Active Problem List   Diagnosis Date Noted  . Chronic hepatitis C without hepatic coma (HCC) 04/15/2017  . Elevated LFTs 04/15/2017  . Dental caries 04/15/2017  . Anxiety 07/16/2016    Patient's Medications  New Prescriptions   No medications on file  Previous Medications   CITALOPRAM (CELEXA) 40 MG TABLET    Take 1 tablet (40 mg total) by mouth daily.   MECLIZINE (ANTIVERT) 12.5 MG TABLET    Take 1 tablet (12.5 mg total) by mouth 3 (three) times daily as needed for dizziness.   SOFOSBUVIR-VELPATASVIR (EPCLUSA) 400-100 MG TABS    Take 1 tablet by mouth daily.   ZOLPIDEM (AMBIEN) 5 MG TABLET    Take 1 tablet (5 mg total) by mouth at bedtime as needed for sleep.  Modified Medications   No medications on file  Discontinued Medications   No medications on file    Allergies: Allergies  Allergen Reactions  . Aspirin Other (See Comments)    Reaction:  Blood in stool and urine   . Food Itching and Other (See Comments)    Pt states that he is allergic to all fruits with skins.   . Ibuprofen Other (See Comments)    Reaction:  Blood in stool and urine   . Naproxen Other (See Comments)    Reaction:  Blood in stool and urine   . Tylenol [Acetaminophen] Other (See Comments)    Reaction:  Blood in stool and urine     Past Medical History: Past Medical History:  Diagnosis Date  . Anemia   . Anxiety   . Arachnoid cyst   . Depression   . Heroin abuse   . IBS (irritable bowel syndrome)   . Kidney stone   . Migraine   . Opiate misuse     Social History: Social History   Socioeconomic History  . Marital status: Single    Spouse name: Not on file  . Number of children: 2  . Years of education: 44  . Highest education  level: Not on file  Social Needs  . Financial resource strain: Not on file  . Food insecurity - worry: Not on file  . Food insecurity - inability: Not on file  . Transportation needs - medical: Not on file  . Transportation needs - non-medical: Not on file  Occupational History  . Occupation: [employer]  Tobacco Use  . Smoking status: Current Some Day Smoker    Packs/day: 0.25    Types: Cigarettes  . Smokeless tobacco: Never Used  . Tobacco comment: "jewel pod"  Substance and Sexual Activity  . Alcohol use: No    Comment: Quit 01/11/16  . Drug use: Yes    Types: IV, Heroin, Other-see comments    Comment: Quit opiates 01/11/16  . Sexual activity: Yes    Partners: Female  Other Topics Concern  . Not on file  Social History Narrative   Lives w/ 6 roommates   Left-handed   Caffeine: 3-5 cups coffee daily    Labs: Hep B S Ab (no units)  Date Value  05/12/2017 BORDERLINE (A)   Hepatitis B Surface Ag (no units)  Date Value  05/12/2017 NON-REACTIVE   HCV Ab (no units)  Date  Value  04/23/2017 REACTIVE (A)    Lab Results  Component Value Date   HCVGENOTYPE 3 05/12/2017    Hepatitis C RNA quantitative Latest Ref Rng & Units 07/01/2017 04/23/2017 04/12/2017  HCV Quantitative NOT DETECTED IU/mL - 365,000(H) CANCELED  HCV Quantitative Log NOT DETECT Log IU/mL <1.18 NOT DETECTED 5.56(H) CANCELED    AST (U/L)  Date Value  04/12/2017 53 (H)  04/12/2017 53 (H)  04/02/2017 88 (H)   ALT (U/L)  Date Value  05/12/2017 155 (H)  04/12/2017 105 (H)  04/12/2017 105 (H)  04/02/2017 172 (H)   INR (no units)  Date Value  05/12/2017 1.0    Fibrosis Score: F0 as assessed by [facility]   Assessment: Sean Rios is here today to follow-up for his Hep C infection. He completed 12 weeks of Epclusa back in December around Christmas. He did not have any side effects while on Epclusa but states he feels like he is "withdrawng" now from it.  I told him that does not happen.  He also states he  recently tapered off of Celexa because he did not like the way it made him feel.  I assume that it is the Celexa tapering that is making him feel that way. He usually took his Epclusa in the morning time but states he forgot twice during the end of the last month and took it at night instead.  He states he may have missed one day total.   I gave him the [facility] financial and [facility] applications for the 2nd time last time he saw me in October but he still does not know if he is approved.  I again gave him the applications and wrote down the numbers for each place to call and see if he is approved.  His Hep C viral load was undetectable when checked back in October, so I will defer getting labs again today.  I told him that when he comes back in March that he has to get labs for his cure visit, so he must get this situated before then.  He said he definitely will.   Plan: - Completed 12 weeks of Epclusa - F/u again 3/28 at 2pm for cure visit and labs  Euan Wandler L. Arian Murley, PharmD, AAHIVP, CPP Infectious Diseases Clinical Pharmacist [facility] 10/04/2017, 2:23 PM

## 2017-11-29 IMAGING — CR DG FOOT COMPLETE 3+V*L*
3 series · 3 of 3 positions shown · non-contrast
Comparison: None.

CLINICAL DATA: Persistent lateral left foot pain after another
person tripped over his foot 2-3 days ago.

EXAM:
LEFT FOOT - COMPLETE 3+ VIEW

[x foot ap left]
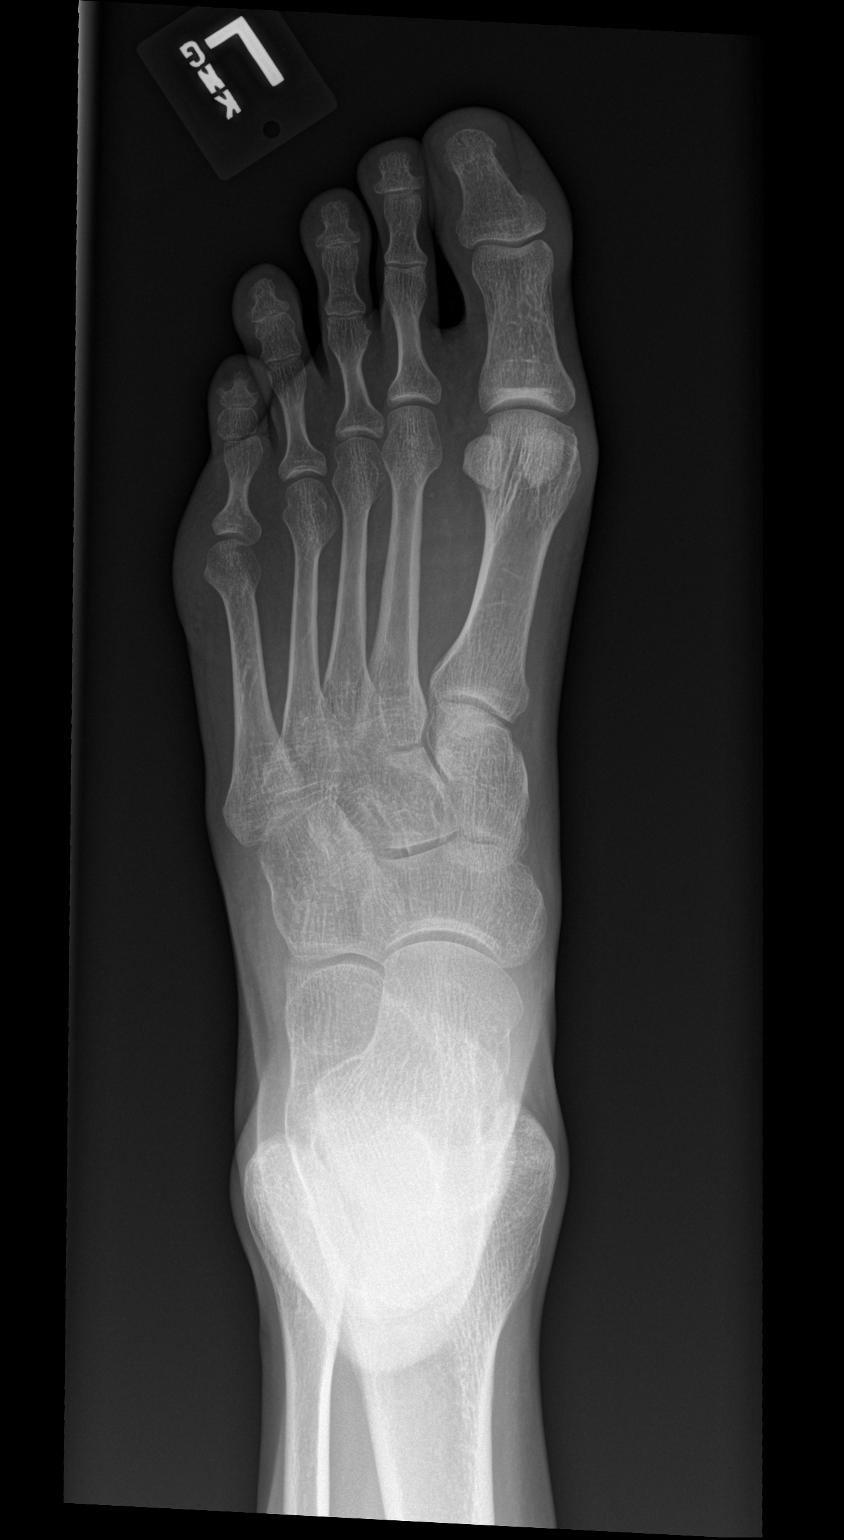

[x foot obl left]
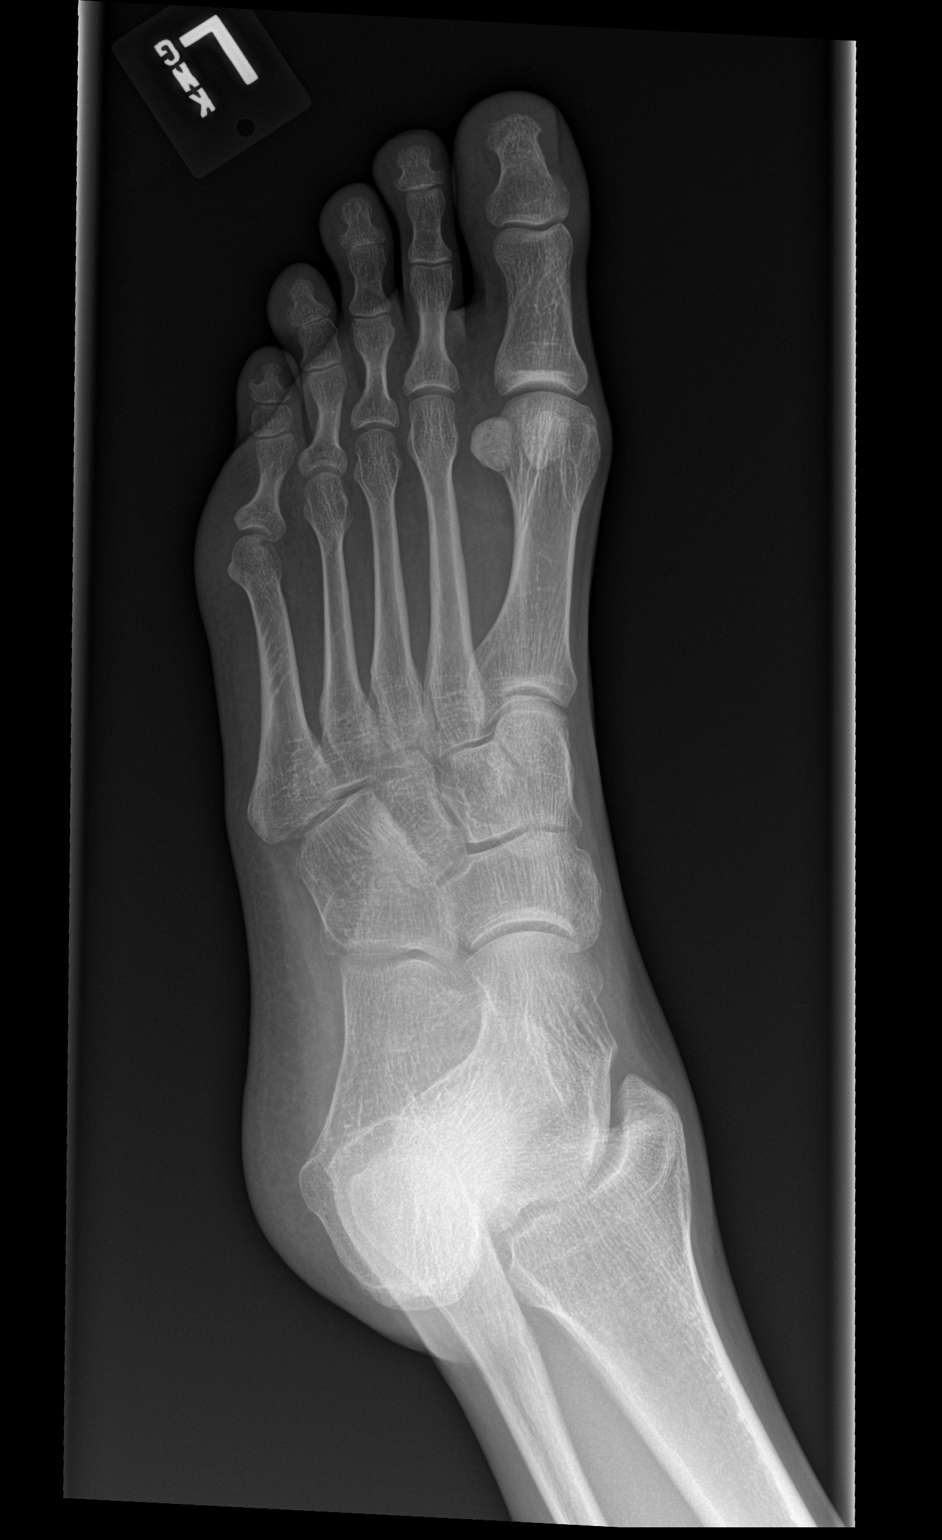

[x foot lat left]
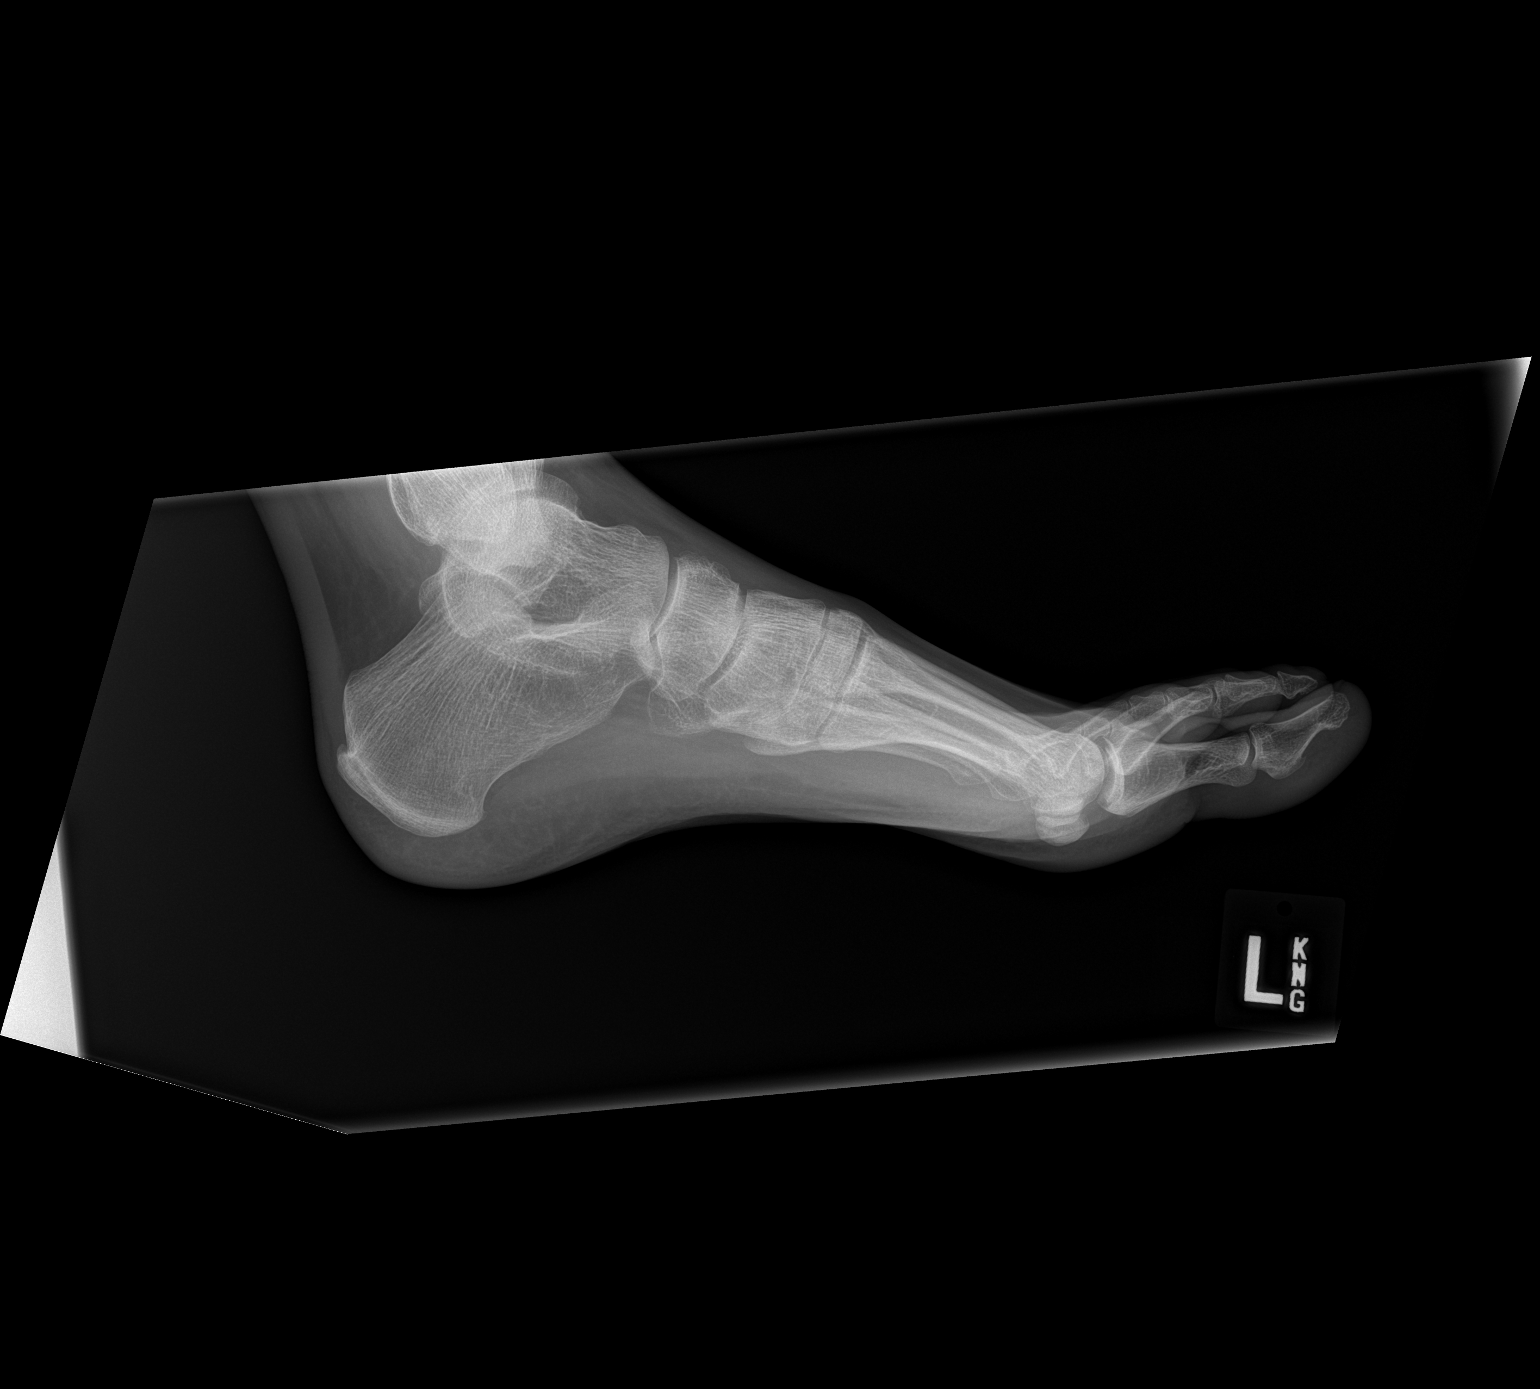

[3 of 3 positions shown; findings below may reference images not displayed]

FINDINGS: There is no evidence of fracture or dislocation. There is no
evidence of arthropathy or other focal bone abnormality. Soft
tissues are unremarkable.
IMPRESSION: Negative.

## 2017-12-02 ENCOUNTER — Ambulatory Visit: Payer: Self-pay

## 2017-12-06 ENCOUNTER — Ambulatory Visit (INDEPENDENT_AMBULATORY_CARE_PROVIDER_SITE_OTHER): Payer: Self-pay | Admitting: Pharmacist Clinician (PhC)/ Clinical Pharmacy Specialist

## 2017-12-06 DIAGNOSIS — B182 Chronic viral hepatitis C: Secondary | ICD-10-CM

## 2017-12-06 NOTE — Progress Notes (Addendum)
HPI: Sean Rios is a 32 y.o. male who presents to the [facility] pharmacy clinic for follow up visit for Hepatitis C cure. He is genotype 3, F0 and completed 12 weeks of Epclusa at the end of 2018.  Lab Results  Component Value Date   HCVGENOTYPE 3 05/12/2017    Allergies: Allergies  Allergen Reactions  . Aspirin Other (See Comments)    Reaction:  Blood in stool and urine   . Food Itching and Other (See Comments)    Pt states that he is allergic to all fruits with skins.   . Ibuprofen Other (See Comments)    Reaction:  Blood in stool and urine   . Naproxen Other (See Comments)    Reaction:  Blood in stool and urine   . Tylenol [Acetaminophen] Other (See Comments)    Reaction:  Blood in stool and urine     Vitals:    Past Medical History: Past Medical History:  Diagnosis Date  . Anemia   . Anxiety   . Arachnoid cyst   . Depression   . Heroin abuse   . IBS (irritable bowel syndrome)   . Kidney stone   . Migraine   . Opiate misuse     Social History: Social History   Socioeconomic History  . Marital status: Single    Spouse name: Not on file  . Number of children: 2  . Years of education: 25  . Highest education level: Not on file  Occupational History  . Occupation: [employer]  Social Needs  . Financial resource strain: Not on file  . Food insecurity:    Worry: Not on file    Inability: Not on file  . Transportation needs:    Medical: Not on file    Non-medical: Not on file  Tobacco Use  . Smoking status: Current Some Day Smoker    Packs/day: 0.25    Types: Cigarettes  . Smokeless tobacco: Never Used  . Tobacco comment: "jewel pod"  Substance and Sexual Activity  . Alcohol use: No    Comment: Quit 01/11/16  . Drug use: Yes    Types: IV, Heroin, Other-see comments    Comment: Quit opiates 01/11/16  . Sexual activity: Yes    Partners: Female  Lifestyle  . Physical activity:    Days per week: Not on file    Minutes per session: Not on file  . Stress: Not on  file  Relationships  . Social connections:    Talks on phone: Not on file    Gets together: Not on file    Attends religious service: Not on file    Active member of club or organization: Not on file    Attends meetings of clubs or organizations: Not on file    Relationship status: Not on file  Other Topics Concern  . Not on file  Social History Narrative   Lives w/ 6 roommates   Left-handed   Caffeine: 3-5 cups coffee daily    Labs: Hep B S Ab (no units)  Date Value  05/12/2017 BORDERLINE (A)   Hepatitis B Surface Ag (no units)  Date Value  05/12/2017 NON-REACTIVE   HCV Ab (no units)  Date Value  04/23/2017 REACTIVE (A)    Lab Results  Component Value Date   HCVGENOTYPE 3 05/12/2017    Hepatitis C RNA quantitative Latest Ref Rng & Units 07/01/2017 04/23/2017 04/12/2017  HCV Quantitative NOT DETECTED IU/mL - 365,000(H) CANCELED  HCV Quantitative Log NOT DETECT  Log IU/mL <1.18 NOT DETECTED 5.56(H) CANCELED    AST (U/L)  Date Value  04/12/2017 53 (H)  04/12/2017 53 (H)  04/02/2017 88 (H)   ALT (U/L)  Date Value  05/12/2017 155 (H)  04/12/2017 105 (H)  04/12/2017 105 (H)  04/02/2017 172 (H)   INR (no units)  Date Value  05/12/2017 1.0    CrCl: CrCl cannot be calculated (Patient's most recent lab result is older than the maximum 21 days allowed.).  Fibrosis Score: F0 as assessed by [facility]   Previous Treatment Regimen: Epclusa  Assessment: Sean Rios is here today for his cure visit for his Hepatis C infection, having finished treatment with Epclusa approximately 3 months ago. He states he did not miss any doses, but he did forget to take it in the morning twice and took it in the evening when he remembered.  He states he has not used drugs in almost 2 years. He was given [facility] financial and [facility] applications again because he could not find the forms he was given at the last appointment. He was instructed to fill out and turn in the [facility] form prior to leaving  today and to [facility].   His hepatitis C viral load was undetectable in October, and he is uninsured so labs were deferred in January. Liver enzymes were elevated in September. He will get his test of cure labs today and will turn in appropriate paperwork for financial assistance.  Recommendations: - Completed 12 weeks of Epclusa in December - Hepatitis C viral load for test of cure - Check LFTs   Kathyrn SheriffLindsey N Foltanski, Pharm.D PGY1 Pharmacy Resident [facility] 12/06/2017, 11:47 AM  Agreed with Lindsey's note. We will call him in the next couple of days with the result. If he is cured, he will not need to f/u with us anymore.   Ulyses SouthwardMinh Suhana Wilner, PharmD, BCPS, AAHIVP, CPP Infectious Disease Pharmacist Pager: [phone] 12/06/2017 12:13 PM

## 2017-12-07 LAB — COMPLETE METABOLIC PANEL WITH GFR
AG RATIO: 1.9 (calc) (ref 1.0–2.5)
ALBUMIN MSPROF: 4.7 g/dL (ref 3.6–5.1)
ALT: 10 U/L (ref 9–46)
AST: 18 U/L (ref 10–40)
Alkaline phosphatase (APISO): 48 U/L (ref 40–115)
BUN: 13 mg/dL (ref 7–25)
CALCIUM: 9.6 mg/dL (ref 8.6–10.3)
CO2: 30 mmol/L (ref 20–32)
Chloride: 104 mmol/L (ref 98–110)
Creat: 0.83 mg/dL (ref 0.60–1.35)
GFR, EST AFRICAN AMERICAN: 134 mL/min/{1.73_m2} (ref >=60)
GFR, EST NON AFRICAN AMERICAN: 116 mL/min/{1.73_m2} (ref >=60)
Globulin: 2.5 g/dL (calc) (ref 1.9–3.7)
Glucose, Bld: 102 mg/dL — ABNORMAL HIGH (ref 65–99)
POTASSIUM: 4.5 mmol/L (ref 3.5–5.3)
Sodium: 139 mmol/L (ref 135–146)
TOTAL PROTEIN: 7.2 g/dL (ref 6.1–8.1)
Total Bilirubin: 0.5 mg/dL (ref 0.2–1.2)

## 2017-12-08 ENCOUNTER — Telehealth: Payer: Self-pay | Admitting: Pharmacist

## 2017-12-08 LAB — HEPATITIS C RNA QUANTITATIVE
HCV QUANT LOG: NOT DETECTED {Log_IU}/mL
HCV RNA, PCR, QN: <15 IU/mL

## 2017-12-08 NOTE — Telephone Encounter (Signed)
Hep C cure visit labs resulted today. Spoke with Fayrene FearingJames and informed him that he is now cured with undetectable viral load and normal liver enzymes. He understands he will no longer need to follow up with [facility] clinic. He affirmed [facility] will [facility].

## 2019-05-07 IMAGING — US US ABDOMEN LIMITED
1 series · 14 of 25 positions shown · non-contrast
Comparison: CT abdomen and pelvis July 07, 2011

CLINICAL DATA: Elevated liver enzymes

EXAM:
ULTRASOUND ABDOMEN LIMITED RIGHT UPPER QUADRANT

[Series 1: us abdomen limited · 0.15mm/px · 14 of 66 slices shown]
[im 1/66]
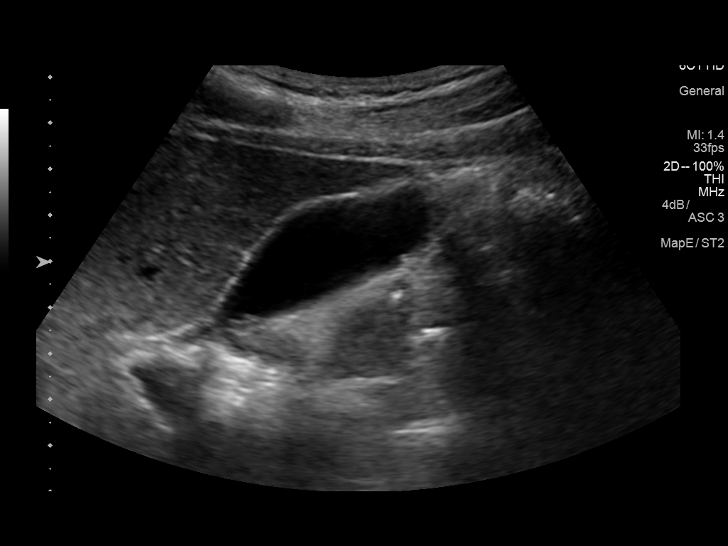
[im 6/66]
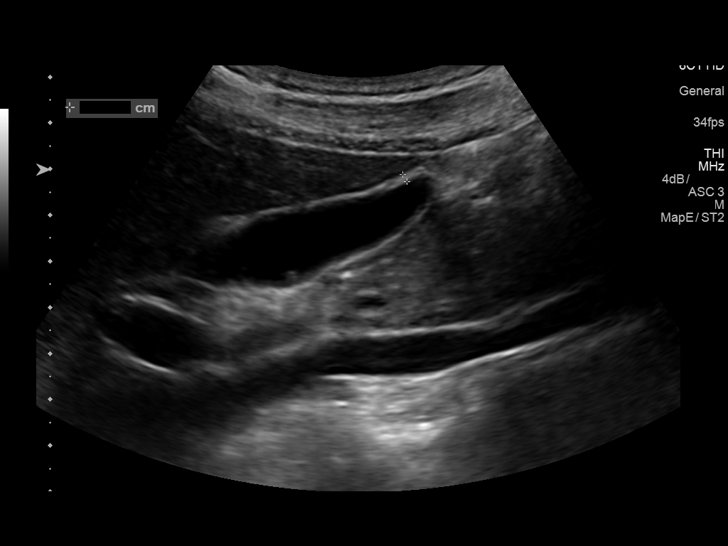
[im 11/66]
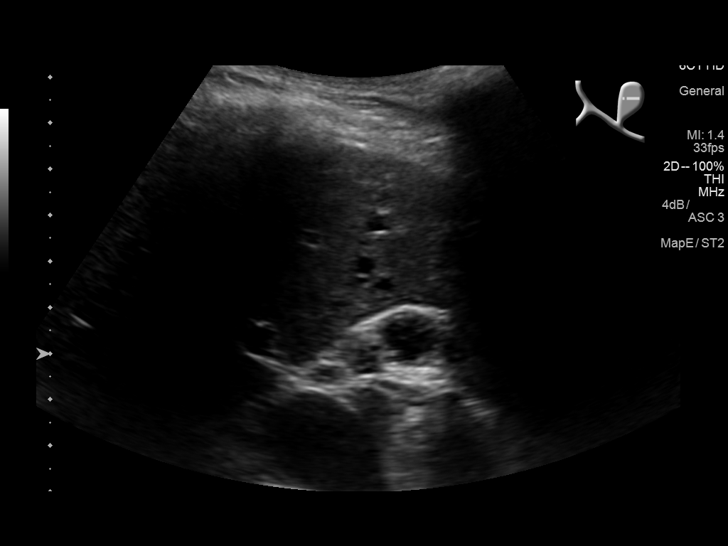
[im 17/66]
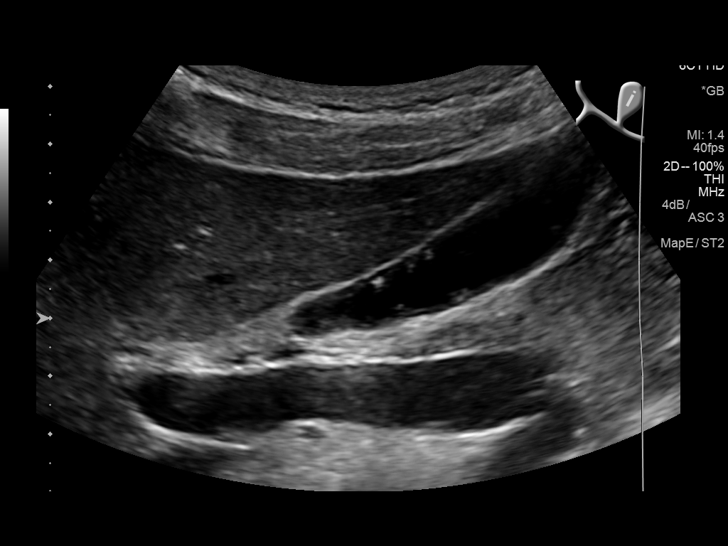
[im 22/66]
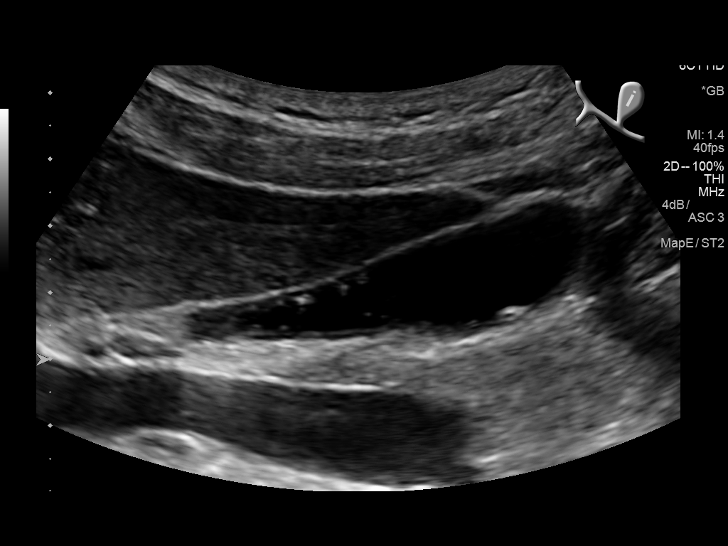
[im 25/66]
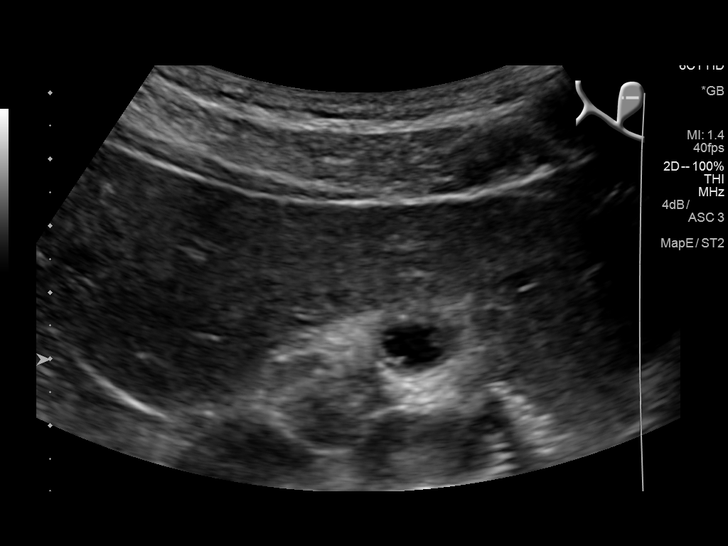
[im 30/66]
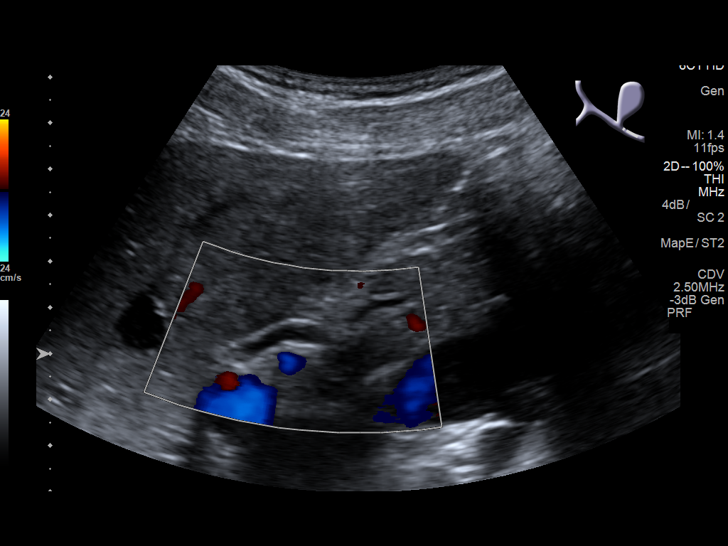
[im 36/66]
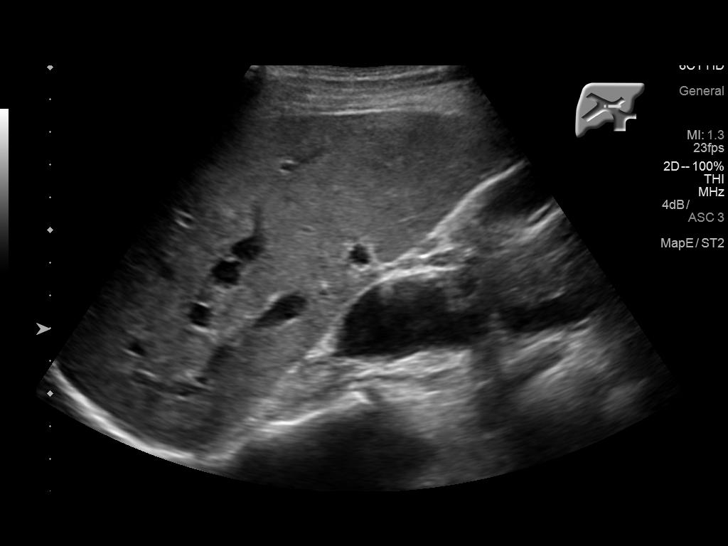
[im 41/66]
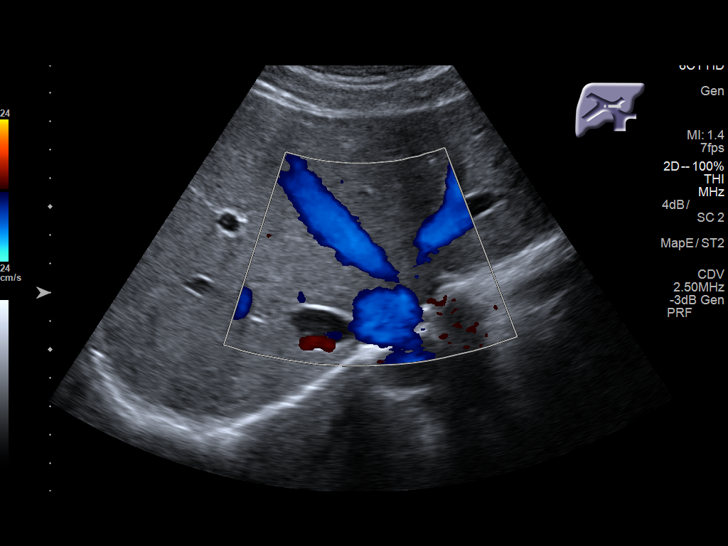
[im 44/66]
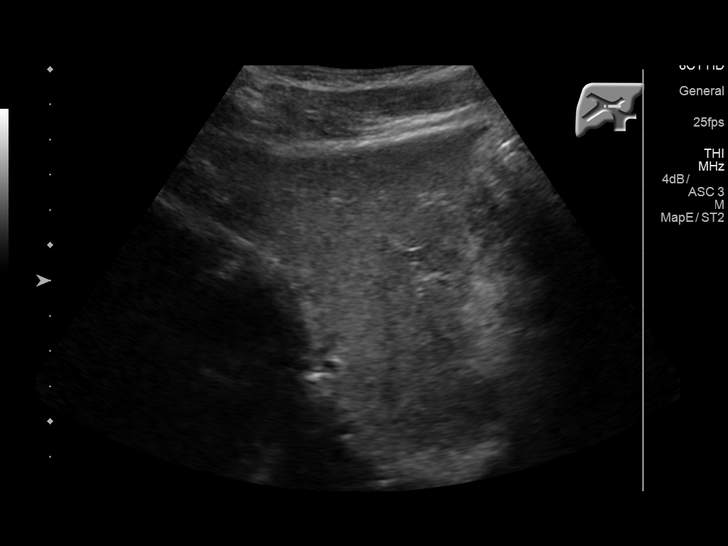
[im 49/66]
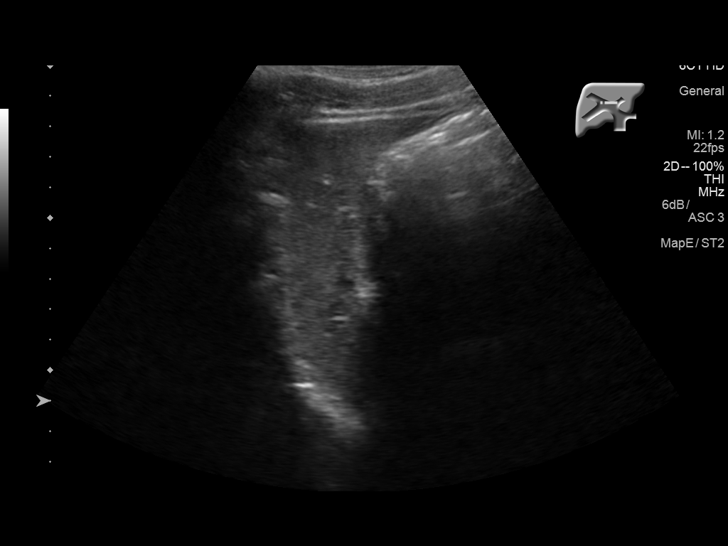
[im 55/66]
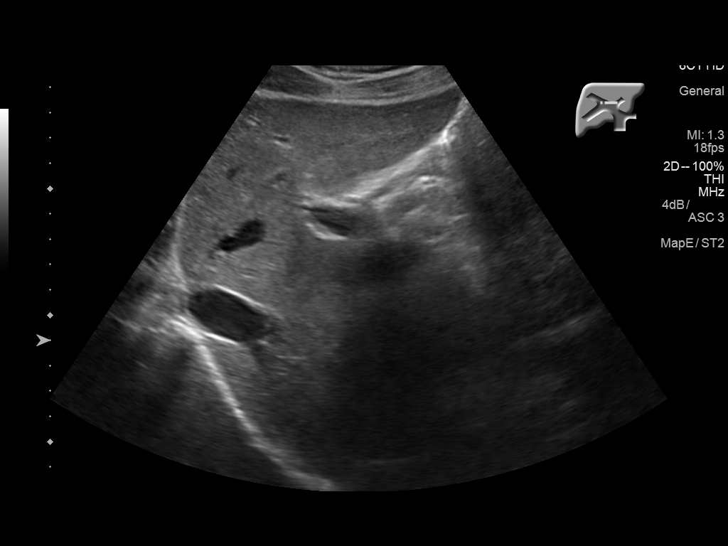
[im 60/66]
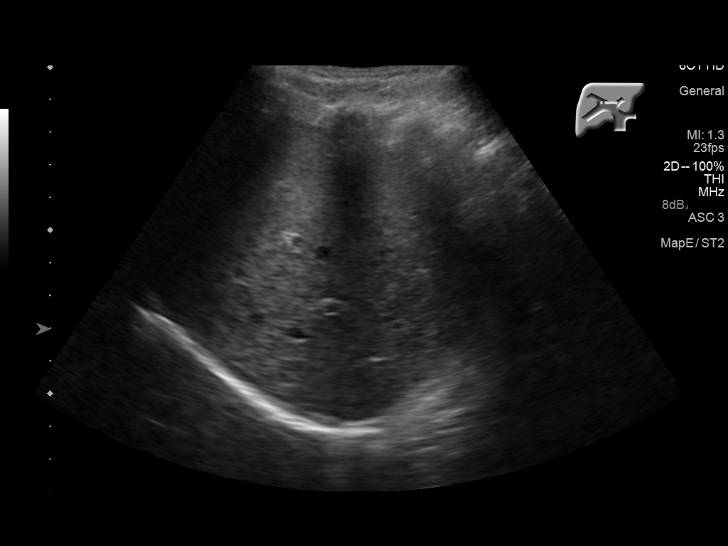
[im 66/66]
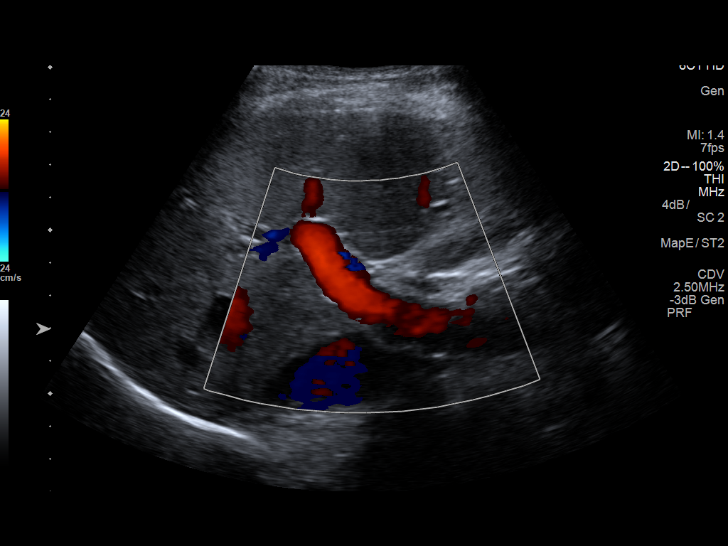

[14 of 25 positions shown; findings below may reference images not displayed]

FINDINGS: Gallbladder:

There are 6 echogenic foci along the gallbladder wall measuring
between 2 and 4 mm which neither move nor shadow, felt to represent
small polyps. Are no echogenic foci which move and shadow as is
expected with cholelithiasis. There is no gallbladder wall
thickening or pericholecystic fluid. No sonographic Murphy sign
noted by sonographer.

Common bile duct:

Diameter: 4 mm. There is no intrahepatic or extrahepatic biliary
duct dilatation.

Liver:

No focal lesion identified. Within normal limits in parenchymal
echogenicity.
IMPRESSION: Multiple 2-4 mm gallbladder polyps. Gallbladder otherwise
unremarkable appearance. Per consensus guidelines, gallstones of
this small size do not warrant additional imaging surveillance.
Study otherwise unremarkable.
